# Patient Record
Sex: Male | Born: 1935 | Race: White | Hispanic: No | Marital: Married | Smoking: Never smoker
Health system: Southern US, Community
[De-identification: ages and names within clinical notes are randomized; demographics above are authoritative.]

## PROBLEM LIST (undated history)

## (undated) DIAGNOSIS — E039 Hypothyroidism, unspecified: Secondary | ICD-10-CM

## (undated) DIAGNOSIS — E079 Disorder of thyroid, unspecified: Secondary | ICD-10-CM

## (undated) DIAGNOSIS — I10 Essential (primary) hypertension: Secondary | ICD-10-CM

## (undated) DIAGNOSIS — E7800 Pure hypercholesterolemia, unspecified: Secondary | ICD-10-CM

## (undated) DIAGNOSIS — R7303 Prediabetes: Secondary | ICD-10-CM

## (undated) DIAGNOSIS — C61 Malignant neoplasm of prostate: Secondary | ICD-10-CM

## (undated) DIAGNOSIS — M8580 Other specified disorders of bone density and structure, unspecified site: Secondary | ICD-10-CM

## (undated) HISTORY — PX: TONSILLECTOMY: SUR1361

## (undated) HISTORY — DX: Prediabetes: R73.03

## (undated) HISTORY — DX: Malignant neoplasm of prostate: C61

## (undated) HISTORY — DX: Pure hypercholesterolemia, unspecified: E78.00

## (undated) HISTORY — DX: Disorder of thyroid, unspecified: E07.9

## (undated) HISTORY — PX: PROSTATE BIOPSY: SHX241

## (undated) HISTORY — DX: Other specified disorders of bone density and structure, unspecified site: M85.80

## (undated) HISTORY — DX: Essential (primary) hypertension: I10

## (undated) HISTORY — PX: BRAIN MENINGIOMA EXCISION: SHX576

---

## 2001-09-22 ENCOUNTER — Other Ambulatory Visit: Admission: RE | Admit: 2001-09-22 | Discharge: 2001-09-22 | Payer: Self-pay | Admitting: Dermatology

## 2002-10-23 ENCOUNTER — Ambulatory Visit (HOSPITAL_COMMUNITY): Admission: RE | Admit: 2002-10-23 | Discharge: 2002-10-23 | Payer: Self-pay | Admitting: General Surgery

## 2002-10-23 ENCOUNTER — Encounter: Payer: Self-pay | Admitting: General Surgery

## 2004-01-07 ENCOUNTER — Ambulatory Visit (HOSPITAL_COMMUNITY): Admission: RE | Admit: 2004-01-07 | Discharge: 2004-01-07 | Payer: Self-pay | Admitting: General Surgery

## 2004-01-07 ENCOUNTER — Inpatient Hospital Stay (HOSPITAL_COMMUNITY): Admission: AD | Admit: 2004-01-07 | Discharge: 2004-01-09 | Payer: Self-pay | Admitting: Neurological Surgery

## 2004-01-13 ENCOUNTER — Inpatient Hospital Stay (HOSPITAL_COMMUNITY): Admission: RE | Admit: 2004-01-13 | Discharge: 2004-01-16 | Payer: Self-pay | Admitting: Neurological Surgery

## 2004-01-14 ENCOUNTER — Encounter (INDEPENDENT_AMBULATORY_CARE_PROVIDER_SITE_OTHER): Payer: Self-pay | Admitting: *Deleted

## 2004-02-03 ENCOUNTER — Ambulatory Visit: Payer: Self-pay | Admitting: Internal Medicine

## 2004-02-03 ENCOUNTER — Encounter (INDEPENDENT_AMBULATORY_CARE_PROVIDER_SITE_OTHER): Payer: Self-pay | Admitting: *Deleted

## 2004-02-03 ENCOUNTER — Inpatient Hospital Stay (HOSPITAL_COMMUNITY): Admission: AD | Admit: 2004-02-03 | Discharge: 2004-02-13 | Payer: Self-pay | Admitting: Neurological Surgery

## 2004-03-09 ENCOUNTER — Ambulatory Visit: Payer: Self-pay | Admitting: Internal Medicine

## 2004-04-04 ENCOUNTER — Ambulatory Visit (HOSPITAL_COMMUNITY): Admission: RE | Admit: 2004-04-04 | Discharge: 2004-04-04 | Payer: Self-pay | Admitting: Neurological Surgery

## 2005-01-18 ENCOUNTER — Ambulatory Visit (HOSPITAL_COMMUNITY): Admission: RE | Admit: 2005-01-18 | Discharge: 2005-01-18 | Payer: Self-pay | Admitting: Neurological Surgery

## 2006-01-15 ENCOUNTER — Ambulatory Visit (HOSPITAL_COMMUNITY): Admission: RE | Admit: 2006-01-15 | Discharge: 2006-01-15 | Payer: Self-pay | Admitting: Neurological Surgery

## 2007-09-24 ENCOUNTER — Ambulatory Visit (HOSPITAL_COMMUNITY): Admission: RE | Admit: 2007-09-24 | Discharge: 2007-09-24 | Payer: Self-pay | Admitting: Pediatrics

## 2007-11-04 ENCOUNTER — Encounter (HOSPITAL_COMMUNITY): Admission: RE | Admit: 2007-11-04 | Discharge: 2007-12-04 | Payer: Self-pay | Admitting: Oncology

## 2007-11-04 ENCOUNTER — Ambulatory Visit (HOSPITAL_COMMUNITY): Payer: Self-pay | Admitting: Oncology

## 2007-11-11 ENCOUNTER — Ambulatory Visit: Admission: RE | Admit: 2007-11-11 | Discharge: 2007-11-26 | Payer: Self-pay | Admitting: Radiation Oncology

## 2007-12-05 ENCOUNTER — Encounter (HOSPITAL_COMMUNITY): Admission: RE | Admit: 2007-12-05 | Discharge: 2007-12-31 | Payer: Self-pay | Admitting: Oncology

## 2008-06-02 ENCOUNTER — Ambulatory Visit (HOSPITAL_COMMUNITY): Payer: Self-pay | Admitting: Oncology

## 2008-10-27 ENCOUNTER — Ambulatory Visit (HOSPITAL_COMMUNITY): Payer: Self-pay | Admitting: Oncology

## 2008-10-27 ENCOUNTER — Encounter (HOSPITAL_COMMUNITY): Admission: RE | Admit: 2008-10-27 | Discharge: 2008-11-26 | Payer: Self-pay | Admitting: Oncology

## 2009-01-04 ENCOUNTER — Ambulatory Visit (HOSPITAL_COMMUNITY): Admission: RE | Admit: 2009-01-04 | Discharge: 2009-01-04 | Payer: Self-pay | Admitting: Urology

## 2009-01-05 ENCOUNTER — Encounter (HOSPITAL_COMMUNITY): Admission: RE | Admit: 2009-01-05 | Discharge: 2009-02-04 | Payer: Self-pay | Admitting: Oncology

## 2009-01-05 ENCOUNTER — Ambulatory Visit (HOSPITAL_COMMUNITY): Payer: Self-pay | Admitting: Oncology

## 2009-03-30 ENCOUNTER — Ambulatory Visit (HOSPITAL_COMMUNITY): Payer: Self-pay | Admitting: Oncology

## 2009-03-30 ENCOUNTER — Encounter (HOSPITAL_COMMUNITY): Admission: RE | Admit: 2009-03-30 | Discharge: 2009-04-29 | Payer: Self-pay | Admitting: Oncology

## 2009-06-08 ENCOUNTER — Encounter (HOSPITAL_COMMUNITY): Admission: RE | Admit: 2009-06-08 | Discharge: 2009-07-08 | Payer: Self-pay | Admitting: Oncology

## 2009-06-20 ENCOUNTER — Ambulatory Visit (HOSPITAL_COMMUNITY): Payer: Self-pay | Admitting: Oncology

## 2009-09-06 ENCOUNTER — Ambulatory Visit (HOSPITAL_COMMUNITY): Payer: Self-pay | Admitting: Oncology

## 2009-09-06 ENCOUNTER — Encounter (HOSPITAL_COMMUNITY)
Admission: RE | Admit: 2009-09-06 | Discharge: 2009-09-30 | Payer: Self-pay | Source: Home / Self Care | Admitting: Oncology

## 2009-11-07 ENCOUNTER — Ambulatory Visit (HOSPITAL_COMMUNITY): Payer: Self-pay | Admitting: Oncology

## 2009-11-07 ENCOUNTER — Encounter (HOSPITAL_COMMUNITY)
Admission: RE | Admit: 2009-11-07 | Discharge: 2009-12-07 | Payer: Self-pay | Source: Home / Self Care | Admitting: Oncology

## 2010-01-03 ENCOUNTER — Encounter (HOSPITAL_COMMUNITY)
Admission: RE | Admit: 2010-01-03 | Discharge: 2010-01-31 | Payer: Self-pay | Source: Home / Self Care | Attending: Oncology | Admitting: Oncology

## 2010-01-03 ENCOUNTER — Ambulatory Visit (HOSPITAL_COMMUNITY)
Admission: RE | Admit: 2010-01-03 | Discharge: 2010-01-31 | Payer: Self-pay | Source: Home / Self Care | Attending: Oncology | Admitting: Oncology

## 2010-01-22 ENCOUNTER — Encounter: Payer: Self-pay | Admitting: Neurological Surgery

## 2010-01-25 ENCOUNTER — Ambulatory Visit (HOSPITAL_COMMUNITY)
Admission: RE | Admit: 2010-01-25 | Discharge: 2010-01-25 | Payer: Self-pay | Source: Home / Self Care | Attending: Urology | Admitting: Urology

## 2010-02-28 ENCOUNTER — Other Ambulatory Visit (HOSPITAL_COMMUNITY): Payer: Medicare Other

## 2010-02-28 ENCOUNTER — Ambulatory Visit (HOSPITAL_COMMUNITY): Payer: Medicare Other

## 2010-03-13 LAB — PSA: PSA: 0.52 ng/mL (ref <=4.00)

## 2010-03-14 LAB — PSA: PSA: 0.38 ng/mL (ref <=4.00)

## 2010-03-16 LAB — PSA: PSA: 0.29 ng/mL (ref 0.10–4.00)

## 2010-03-19 LAB — BASIC METABOLIC PANEL
BUN: 15 mg/dL (ref 6–23)
CO2: 32 mEq/L (ref 19–32)
Calcium: 9.6 mg/dL (ref 8.4–10.5)
Chloride: 100 mEq/L (ref 96–112)
Creatinine, Ser: 1.18 mg/dL (ref 0.4–1.5)
GFR calc Af Amer: >60 mL/min (ref >60)
GFR calc non Af Amer: >60 mL/min (ref >60)
Glucose, Bld: 100 mg/dL — ABNORMAL HIGH (ref 70–99)
Potassium: 2.8 mEq/L — ABNORMAL LOW (ref 3.5–5.1)
Sodium: 140 mEq/L (ref 135–145)

## 2010-03-19 LAB — CBC
HCT: 39.1 % (ref 39.0–52.0)
Hemoglobin: 13.6 g/dL (ref 13.0–17.0)
MCH: 33 pg (ref 26.0–34.0)
MCHC: 34.8 g/dL (ref 30.0–36.0)
MCV: 94.8 fL (ref 78.0–100.0)
Platelets: 193 10*3/uL (ref 150–400)
RBC: 4.12 MIL/uL — ABNORMAL LOW (ref 4.22–5.81)
RDW: 13.4 % (ref 11.5–15.5)
WBC: 8.4 10*3/uL (ref 4.0–10.5)

## 2010-03-19 LAB — PSA
PSA: 0.34 ng/mL (ref 0.10–4.00)
PSA: 0.34 ng/mL (ref 0.10–4.00)

## 2010-03-19 LAB — TESTOSTERONE
Testosterone: 33.56 ng/dL — ABNORMAL LOW (ref 350–890)
Testosterone: 19.67 ng/dL — ABNORMAL LOW (ref 350–890)

## 2010-03-19 LAB — TESTOSTERONE, % FREE: Testosterone-% Free: 2 % — ABNORMAL HIGH (ref 1.6–2.9)

## 2010-03-19 LAB — TESTOSTERONE, FREE: Testosterone, Free: 4 pg/mL — ABNORMAL LOW (ref 47.0–244.0)

## 2010-03-19 LAB — SEX HORMONE BINDING GLOBULIN: Sex Hormone Binding: 26 nmol/L (ref 13–71)

## 2010-03-20 LAB — PSA: PSA: 0.28 ng/mL (ref 0.10–4.00)

## 2010-04-25 ENCOUNTER — Encounter (HOSPITAL_COMMUNITY): Payer: Medicare Other | Attending: Oncology

## 2010-04-25 DIAGNOSIS — C61 Malignant neoplasm of prostate: Secondary | ICD-10-CM

## 2010-05-01 ENCOUNTER — Ambulatory Visit (HOSPITAL_COMMUNITY): Payer: Medicare Other | Admitting: Oncology

## 2010-05-03 ENCOUNTER — Encounter (HOSPITAL_COMMUNITY): Payer: Medicare Other | Attending: Oncology | Admitting: Oncology

## 2010-05-03 DIAGNOSIS — C7982 Secondary malignant neoplasm of genital organs: Secondary | ICD-10-CM

## 2010-05-03 DIAGNOSIS — C61 Malignant neoplasm of prostate: Secondary | ICD-10-CM | POA: Insufficient documentation

## 2010-05-09 ENCOUNTER — Other Ambulatory Visit (HOSPITAL_COMMUNITY): Payer: Self-pay | Admitting: Oncology

## 2010-05-09 DIAGNOSIS — C61 Malignant neoplasm of prostate: Secondary | ICD-10-CM
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
## 2010-05-19 NOTE — Discharge Summary (Signed)
NAME:  Shane [name], Shane [name] NO.:  [id number]   MEDICAL RECORD NO.:  [id number]          PATIENT TYPE:  INP   LOCATION:  3029                         FACILITY:  [facility]   PHYSICIAN:  Payton Doughty, M.D.      DATE OF BIRTH:  11-Aug-1934   DATE OF ADMISSION:  02/03/2004  DATE OF DISCHARGE:  02/13/2004                                 DISCHARGE SUMMARY   ADMISSION DIAGNOSIS:  Dilantin reaction.   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   DICTATING DOCTOR:  Dr. Channing Mutters __________ Neurosurgery.   ATTENDING:  Dr. Danielle Dess.   CONSULTS:  Obtained from the [facility] and from Infectious Diseases  as well as Ophthalmology.   This is a 76 year old gentleman who had had a meningioma resected about  three weeks prior to admission.  Three days before admission he began having  fevers, he had a rash on his neck and he had placed cortisone on it.  He was  switched to Phenobarbital.  Lesions began developing in his mouth a couple  of days before admission, and he was having some difficulty swallowing.  He  was admitted with a rash and visited by the medicine service.  It was felt  that he was having a severe Dilantin reaction.  He had hydration and was  placed on oral steroids.  He developed some opacities over his eyes, he was  visited by Dr. Dagoberto Ligas of Ophthalmology, as well as Dr. Orvan Falconer of  Infectious Disease.  Because of concerns about viral infestation, he was  placed on Valtrex and a host of eye medications by Dr. Dagoberto Ligas.  Over  the next few days his ophthalmologic problems began resolving, as did his  difficulties with increased liver enzymes, etcetera.  He has remained on  Decadron and over the 7-8 week course of these medications, he feels  markedly better.  He has a lot of oral sloughing, which is stable, he is  able to eat and void normally now.  He has been switched over to oral and  topical medications.  He feels well  enough to go home.  He is afebrile at  this time.  He is going home on the eye medications as recommended by Dr.  Dagoberto Ligas, as well on the oral Decadron.  The Valtrex has been stopped.  His followup will be with Dr. Danielle Dess via phone call tomorrow, and via visit  later in the week.      MWR/MEDQ  D:  02/13/2004  T:  02/13/2004  Job:  [id number]

## 2010-05-19 NOTE — Discharge Summary (Signed)
NAME:  MICHAELJAMES, MILNES NO.:  [id number]   MEDICAL RECORD NO.:  [id number]          PATIENT TYPE:  OIB   LOCATION:  3109                         FACILITY:  [facility]   PHYSICIAN:  Coletta Memos, M.D.     DATE OF BIRTH:  [birth year]   DATE OF ADMISSION:  01/13/2004  DATE OF DISCHARGE:  01/16/2004                                 DISCHARGE SUMMARY   ADMISSION DIAGNOSIS:  Right frontal meningioma.   DISCHARGE DIAGNOSIS:  Right frontal meningioma.   PROCEDURE:  Right frontal craniotomy with gross total resection of  meningioma.   COMPLICATIONS:  None.   CONDITION ON DISCHARGE:  Alive and well.   DISCHARGE MEDICATIONS:  1.  Decadron taper over 10 days 4 mg t.i.d. x3, 4 mg p.o. b.i.d. x3, 4 mg      daily x4.  2.  Dilantin 100 mg p.o. t.i.d. given 100 tablets.  3.  Tylenol for pain.   HISTORY OF PRESENT ILLNESS:  The patient is a gentleman who is seen and  evaluated by Stefani Dama, M.D. with regards to newly diagnosed brain  tumor.  It was felt in his best interest that it be removed.  Secondary to  that, he was admitted and underwent an uncomplicated craniotomy for removal  of the tumor.  Postoperatively, his course was complicated by what was  either epistaxis or possibly a rhinorrhea.  There was no actual evidence of  rhinorrhea, other than the fact that the sanguineous drainage was very thin.  It think it is much more likely that this was epistaxis and residual left  from his intubation.  CT was done postoperatively, but it did not show a  breach of the frontal sinuses, nor any direct communication between the  intracranial cavity and the sinuses.  There is no fluid in the sinuses  either.  The serosanguineous drainage resolved on its own and he has been  dry for the last 24 hours.  Given that and given his normal neurologic  examination, the patient will be discharged home.  His wound is clean, dry,  and without signs of infection.  He will have a return  appointment in  approximately 7 to 10 days for suture removal.  The patient knows he is not  to drive nor engage in any strenuous activity.  He may wash his hair, he is  simply not to scrub the incision.  He is tolerating a regular diet and  ambulating well.       KC/MEDQ  D:  01/16/2004  T:  01/16/2004  Job:  [id number]

## 2010-05-19 NOTE — Op Note (Signed)
NAME:  Shane [name], Shane [name] NO.:  [id number]   MEDICAL RECORD NO.:  [id number]          PATIENT TYPE:  OIB   LOCATION:  3109                         FACILITY:  [facility]   PHYSICIAN:  Stefani Dama, M.D.  DATE OF BIRTH:  [birth year]   DATE OF PROCEDURE:  01/13/2004  DATE OF DISCHARGE:                                 OPERATIVE REPORT   PREOPERATIVE DIAGNOSIS:  Right frontal brain tumor.   POSTOPERATIVE DIAGNOSIS:  Same, meningioma.   PROCEDURES:  Right frontal craniotomy, gross total resection of brain tumor,  meningioma.   SURGEON:  Stefani Dama, M.D.   ASSISTANT:  Cristi Loron, M.D.   ANESTHESIA:  General endotracheal.   INDICATIONS:  Mr. Bethea is a 76 year old individual who has had history  of a fall about a week ago with some unexplained loss of balance. CT scan of  the brain was performed demonstrating presence of a large right frontal  meningioma. The patient was taken to the operating room to undergo surgical  resection of this lesion at the current time.   PROCEDURE:  The patient was brought to the operating room and placed on  table in the supine position. After smooth induction of general endotracheal  anesthesia. He was placed in a three-point head rest after shaving the scalp  completely. His head was slightly turned to left to expose the right frontal  region. The scalp was then prepared by scrubbing it with Iodoform scrub and  shaving it closely and then DuraPrep prep was performed. The right frontal  region was marked for a standard right frontal craniotomy and the scalp was  marked. The skin was infiltrated with 0.05% Marcaine with 50/50 with  lidocaine and epinephrine. The scalp was then incised and the scalp flap was  raised frontally. Hemostasis from the galeal edges was obtained with the use  of Raney clips. The galea was elevated anteriorly. Temporalis fascia was  then separated and reflected anteriorly also. The underlying bone  was  dissected free and the dissection was then carried in the subperiosteal  plane down to the level of the brow. A bur hole was then placed just  anterior to the coronal suture about 3 cm lateral to it. From this then a  high-speed craniotome was used to elevate the bone flap anteriorly into the  frontal region low across the brow just above the frontal sinus and  laterally out towards the temporalis fossa. A second bur hole was then  placed over the keyhole site and then the craniotomy was completed by  dissecting from the keyhole site with the high-speed drill removing a flap  of bone that ultimately crossed at the meningeal artery which bled  profusely. Bone flap was elevated and controlling the middle meningeal  artery was obtained as quickly as possible. The bone was noted to be  infiltrated with tumor in the midportion and the tumor itself also bled  profusely. This was tamponaded with some pledgets of Gelfoam soaked in  thrombin and 1 x 1 cottonoid patties. Once the middle meningeal artery was  controlled, the dura was  then inspected and the posterior aspect of the dura  was incised and was reflected forward so as to allow for dissection of the  tumor which was densely adherent to the cortical surface of the brain. Then  by doing a piecemeal dissection of the dura and the brain tumor away from  the brain proper, the frontal lobe was secured and a sub-peel dissection was  performed here to minimize trauma to the brain itself. The tumor was  ultimately elevated on a large flap of dura which was clearly involved with  tumor. This tumor was elevated. Hemostasis was readily obtained from the  peel surfaces around the edges of the brain and the dural edges were  cauterized during this resection so that they were also hemostatic at this  point. The dural edges were identified completely around the resected brain  tumor and it was felt that a flap of bovine pericardium would fill this   area. A 6 x 8 cm piece of Duragard was used for this purpose. This was sewn  into the dural defect in a circumferential fashion using interrupted 4-0  Nurolon sutures. This was performed after adequate hemostasis of all peel  edges of the brain was assured and it was assured that no remnants of tumor  were left either attached to the brain or attached to the lateral aspects of  the dura. With this the dural closure was performed in such fashion so that  it was watertight. Then tack up sutures were placed around the perimetry of  the bone. The undersurface of the bone was shaved slightly where thickened  pericranium was noted and this was done so as to make sure that there was no  remnants of any brain tumor here. The bone flap was then inspected and the  gross tumor involvement was removed and the bone was shaved until all  remnants of any tumor that was in the undersurface of the bone was removed  also. The bone was noted to be significantly hyperostotic in reaction to the  tumor itself. The bone flap was then irrigated copiously with antibiotic  irrigating solution and then several popping tack-up holes were placed  through the center of the flap. Once the dura was secured, Osteomed bone  plates were used around the perimetry. A total of five Osteomed plates of  medium diameter were used to secure the bone flap. A bur hole cover was used  over the posterior bur hole. The keyhole could not be secured with any bur  hole flap. The popping suture was tied up in the center of the dural opening  and finally the galea was reflected. The temporalis fascia was closed with 2-  0 Vicryl in interrupted fashion. The galea was closed with 2-0 Vicryl in  inverted interrupted fashion and the scalp was closed with nylon. Blood loss  for the entire procedure was estimated at 800 cc. Frozen section diagnosis  was very typical-looking meningioma.      Henr  HJE/MEDQ  D:  01/13/2004  T:  01/14/2004  Job:   [id number]

## 2010-05-19 NOTE — Discharge Summary (Signed)
NAME:  Shane [name], SANZO NO.:  [id number]   MEDICAL RECORD NO.:  [id number]          PATIENT TYPE:  INP   LOCATION:  3036                         FACILITY:  [facility]   PHYSICIAN:  Stefani Dama, M.D.  DATE OF BIRTH:  March 17, 1934   DATE OF ADMISSION:  01/07/2004  DATE OF DISCHARGE:  01/09/2004                                 DISCHARGE SUMMARY   ADMITTING DIAGNOSES:  Right frontal meningioma.   DISCHARGE DIAGNOSES:  Right frontal meningioma.   OPERATION:  None.   HOSPITAL COURSE:  Mr. Shane [name] is a 76 year old individual who was  diagnosed with a right frontal meningioma after he sustained a routine fall.  CT scan to rule out any intracranial trauma demonstrates the presence of a  large right frontal lesion with significant vasogenic edema.  He is admitted  after being seen in the office because of the degree of subfalcine  herniation that was present.  It was felt that he would benefit from high  dose corticosteroid therapy and this was administered on the evening of  admission.  Following day the patient was somewhat giddy and very euphoric.  He was changed over to oral steroid medications.  He was also started on  Dilantin.  His Dilantin level at the time of discharge is 15.6 and his  condition has stabilized.  He was noted to also be extremely hypertensive at  the time of discharge with blood pressures of 208/104.  His blood pressures  have come down spontaneously to a level of 160/83.  The patient is on  Maxzide as a diuretic.  He received some potassium supplementation for a  potassium of 3.3.  The plan is to have the patient readmitted for surgical  extirpation of this lesion this coming Thursday, the 12th of January.      Henr   HJE/MEDQ  D:  01/09/2004  T:  01/09/2004  Job:  [id number]

## 2010-05-19 NOTE — Consult Note (Signed)
NAME:  Shane [name], Shane [name]               ACCOUNT NO.:  [id number]   MEDICAL RECORD NO.:  [id number]          PATIENT TYPE:  INP   LOCATION:  3029                         FACILITY:  [facility]   PHYSICIAN:  Melissa L. Ladona Ridgel, MD  DATE OF BIRTH:  03/13/1934   DATE OF CONSULTATION:  02/03/2004  DATE OF DISCHARGE:                                   CONSULTATION   CHIEF COMPLAINT:  Rash and mouth lesions.   REFERRING PHYSICIAN:  Consult was called by Stefani Dama, M.D.   HISTORY OF PRESENT ILLNESS:  The patient is a 76 year old white male who is  admitted today with worsening rash and ulcerations of his mouth and throat.  The patient was started three weeks ago on Dilantin surrounding craniotomy  for resection of a meningioma.  Approximately three days ago the patient  started to have fevers as high as 102.  His wife recalls that he developed a  slight rash on his neck for which they put cortisone over the weekend.  By  early week, the patient was seen by Dr.  Danielle Dess who also noted that he had a  light pale rash on his upper arms.  The Dilantin was discontinued and he was  started on phenobarbital.  The patient states that the phenobarbital caused  him to be excessively sedated and so therefore, he stopped the medication  himself and in following up with Dr. Danielle Dess today, was found to have  worsening rash on the back, upper body and the ulcerations in the mouth and  trouble swallowing.  The patient states that the oral lesions developed over  the last two days.  The only other exposure the patient had and this was  after the onset of the rash was that he was started on Cipro earlier in the  week.  When Dr. Danielle Dess saw him, he thought that the temperatures may be  related to a urinary tract infection and the Cipro has been discontinued.  Of note the Cipro was after the initial onset of the rash.  Compounding the  current situation is the patient requires high dose steroids status post   craniotomy.   REVIEW OF SYSTEMS:  Significant for high fevers, no chills, no nausea, no  vomiting, no diarrhea.  The patient does not complain of abdominal pain.  He  did have some gas earlier.  He states that the rash is minimally itchy and  he is definitely having trouble swallowing.  All other review of systems are  negative.   PAST MEDICAL HISTORY:  1.  The patient had a meningioma.  He is status post resection.  2.  Hypertension.  3.  Hypothyroidism.   PAST SURGICAL HISTORY:  1.  Tonsillectomy.  2.  Craniotomy.   SOCIAL HISTORY:  He has never smoked. He drinks occasionally.  He is  employed as a Environmental manager.   FAMILY HISTORY:  Mother is deceased in her older age secondary to pneumonia.  She has really no other medical problems.  Father is deceased with  hypertension, coronary artery disease.   ALLERGIES:  No known drug allergies although  he appears to have had a severe  reaction to DILANTIN.   MEDICATIONS AT HOME:  1.  Maxzide 375/25 daily.  2.  Lovoxyl 75 mcg.   LABORATORY DATA:  Sodium 130, potassium 3.7, chloride 96, CO2 24, BUN 17,  creatinine 1.4.  Glucose 108.  His LFTs are elevated at 78 for the AST, 62  for the ALT.  Looking back in his results, January 6, his LFTs are within  normal limits.  His white blood cell count is 4900, his hemoglobin  is 12,  hematocrit 34.3, and platelet count 192,000.   PHYSICAL EXAMINATION:  VITAL SIGNS:  T-max is 102.8, current temperature is  99.4.  Blood pressure is 135/70, last pulse was 122 but this was associated  with fever.  My calculation of his pulse rate during exam was 85.  Respiratory rate is 20.  Oxygen saturations are 92% on room air.  GENERAL:  This a well-developed, well-nourished white male in mild distress  secondary to trouble swallowing and pain in his mouth.  HEENT:  He is normocephalic with a well-healed craniotomy scar involving the  right frontoparietal area.  Pupils equal, round and reactive to light.   Extraocular movements are intact.  Mucous membrane are excessively moist  with obvious ulcerations on the palate and desquamating skin.  There is no  stridor noted.  He does have mild lip crusting.  NECK:  His neck is supple, but tender with nonmobile, mildly enlarged lymph  nodes.  He has no bruits.  He does have a macular papular pustular rash all  over his face and his conjunctivae are red and injected.  There is obvious  mucus on the lid margins and eye lashes.  CHEST:  Clear to auscultation with rhonchi, rales or wheezes.  He is  decreased at both bases.  CARDIOVASCULAR:  Regular rate and rhythm, positive S1 and S2.  No S3 or S4.  ABDOMEN:  Soft, nontender, nondistended with positive bowels.  He does have  a diffuse macular papular pustular rash all over this chest and abdomen.  EXTREMITIES:  Diffuse macular papular pustular rash on the upper extremities  and thighs down to the knees.  There does not appear to be lower extremity  or foot involvement.  There does not appear to be palmar involvement or sole  involvement.  NEUROLOGIC:  He is awake and alert, oriented times three.  Cranial nerves II-  XII are intact.  Power 5/5.   ASSESSMENT/PLAN:  The patient is a 76 year old white male status post  craniotomy early in January to resect a meningioma. The patient is admitted  today trouble swallowing secondary to ulcerations in his mouth and  desquamation of his mucous membranes.  He has an associated diffuse macular  papular rash on the trunk and extremities.  The reaction is likely secondary  to Dilantin, which was discontinued at the beginning of the week.  This  likely represents Stevens-Johnson syndrome; however, toxic epidermal  necrolysis cannot be ruled out.  But based on the current constellation of  symptoms rash, mucus cutaneous involvement and ocular involvement with spiking temperature, I would favor Stevens-Johnson.  However, the elevated  liver function tests move the  syndrome closer to a TEN presentation.  Hopefully, the patient will continued to improve since the offending agent  has been discontinued.  However, he may have complications related to using  Decadron for his postoperative state.  He will be predisposed to  superinfection in particular.  I agree with discontinuation  of offending  agent, namely Dilantin and the removal of Cipro which likely was not  involved since the rash was there prior to being started but certainly it  clouds our view.  His current risks are for aspiration, superinfection,  hypotension, extension and worsening of the rash.  We will proceed as  follows:  1.  We will request a dermatology consult in the morning to confirm the      diagnosis.  2.  Pulmonary:  We will provide the patient with a Yankauer suction device      and teaching to assist him in mobilizing his secretions.  We will take a      chest x-ray to rule out aspiration or obvious infiltrates which can      appear with toxic epidermal necrolysis.  Will follow his saturations      closely as decompensated respiratory status can occur in TEN.  3.  Ophthalmological:  The patient appears to have conjunctival involvement.      Will recommend artificial tears, frequent cleansing of the mucosa and      eyelid margins.  We also recommend an ophthalmology consult in the a.m.      in order to assure that he received appropriate ophthalmologic care as      there are serious sequelae to Stevens-Johnson involvement of the eye.  4.  Gastrointestinal:  I agree with Pepcid twice daily to protect his      esophagus and stomach.  The patient should have a nutrition consult and      would recommend changing his diet to liquids and or purees with      aspiration precautions.  He can continue the Magic Mouthwash for his      mouth. The elevation in his liver enzymes can be associated with toxic      epidermal necrolysis and I would therefore follow his a.m. labs      closely.   5.  GU:  Will check a urinalysis and culture and sensitivity.  6.  Endocrine:  Will monitor his blood sugars on the Decadron and keep his      fasting blood sugars less than 150 to promote healing and decrease the      chances of a superinfection.  7.  Dermatology:  As stated I would request a consult in the morning.  The      primary service should obtain the consults.  The rule out needs to be      between Stevens-Johnson syndrome versus toxic epidermal necrolysis.  I      doubt that this represents a staphylococcal skin syndrome based on the      context and progression of the symptomatology; however, the patient is      immunocompromised.  It is therefore important to separate out the true      diagnosis.  I also will be sending herpes antibodies as in his      deconditioned state and immunological compromise state herpes simplex      can cause a similar reaction. 8.  Cardiovascular:  The patient was earlier quite tachycardiac.  This was      related to fever.  At this time this appears to have resolved.  I will      recommend frequent checks of his vital signs and we will gently      rehydrate him as already ordered by the primary service.   Thank you for allowing [place] to see your patient.  We  continue to follow with  you and assist you in his care.      MLT/MEDQ  D:  02/03/2004  T:  02/04/2004  Job:  [id number]   cc:   Stefani Dama, M.D.  [address]  [place]  [place] [address]  Fax: [phone]

## 2010-05-19 NOTE — H&P (Signed)
NAME:  Shane, [name] NO.:  [id number]   MEDICAL RECORD NO.:  [id number]          PATIENT TYPE:  INP   LOCATION:  3029                         FACILITY:  [facility]   PHYSICIAN:  Stefani Dama, M.D.  DATE OF BIRTH:  Jun 20, 1934   DATE OF ADMISSION:  02/03/2004  DATE OF DISCHARGE:                                HISTORY & PHYSICAL   ADMISSION DIAGNOSES:  1.  Dilantin reaction.  2.  Fever.  3.  Pharyngitis.   HISTORY OF PRESENT ILLNESS:  Mr. Shane [name] is a 76 year old individual who  had a craniotomy for resection of a large right frontal meningioma  approximately two and a half weeks ago. He was discontinued home without any  difficulties and did well until the beginning of this week when he noticed  that he was feeling poorly and he had a fever to 101. On evaluation in the  office a few days ago it was felt that he had a Dilantin reaction. He was  advised to stop the Dilantin, he was given some phenobarbital for a few days  and also started on some Cipro because of the concern that he might have  urinary tract infection. Despite this his fevers seem to get worse. The  patient's rash was decidedly worse in the last two days and yesterday he  developed significant symptoms of a sore throat, swelling of his tongue and  his oral mucosa, and difficulty with swallowing. All symptoms and signs  consistent with a pharyngitis. Because he was feeling so poorly and had a  fever to 104 last night, I advised to come to the office. On evaluation in  the office I note that he has white, spotted, and slick areas on his tongue.  He has oral lesions consistent with oral thrush. The patient also has a  maculopapular rash that is very prominent about his scalp, his trunk, and  all his extremities, despite the fact that he has not had Dilantin now for  three days. At this point he is to be admitted to the hospital to undergo IV  hydration, IV Decadron, some treatment for the oral thrush  with a Magic  Mouthwash solution, and further observation. Hospitalist consult will be  obtained.   PHYSICAL EXAMINATION:  At this time, the rash as noted. His blood pressure  is 122/72, heart rate 88 and regular. He has no orthostatic signs. His lungs  are clear to auscultation. His heart has a regular rate and rhythm.  The  abdomen is soft, bowel sounds positive. No masses are palpable. Extremities  reveal no clubbing, cyanosis, or edema.   IMPRESSION:  1.  Fever secondary to Dilantin reaction.  2.  Oral pharyngitis secondary to Dilantin reaction.   PLAN:  IV hydration and Decadron.      HJE/MEDQ  D:  02/03/2004  T:  02/03/2004  Job:  [id number]

## 2010-05-23 ENCOUNTER — Other Ambulatory Visit (HOSPITAL_COMMUNITY): Payer: Medicare Other

## 2010-05-31 ENCOUNTER — Encounter (HOSPITAL_COMMUNITY): Payer: Medicare Other

## 2010-05-31 ENCOUNTER — Other Ambulatory Visit (HOSPITAL_COMMUNITY): Payer: Self-pay | Admitting: Oncology

## 2010-05-31 DIAGNOSIS — C61 Malignant neoplasm of prostate: Secondary | ICD-10-CM

## 2010-06-27 ENCOUNTER — Encounter (HOSPITAL_COMMUNITY): Payer: Medicare Other | Attending: Oncology

## 2010-06-27 ENCOUNTER — Other Ambulatory Visit (HOSPITAL_COMMUNITY): Payer: Self-pay | Admitting: Oncology

## 2010-06-27 DIAGNOSIS — C61 Malignant neoplasm of prostate: Secondary | ICD-10-CM | POA: Insufficient documentation

## 2010-07-25 ENCOUNTER — Encounter (HOSPITAL_COMMUNITY): Payer: Medicare Other | Attending: Oncology

## 2010-07-25 DIAGNOSIS — C61 Malignant neoplasm of prostate: Secondary | ICD-10-CM | POA: Insufficient documentation

## 2010-07-25 NOTE — Progress Notes (Signed)
Labs drawn today for psa 

## 2010-07-26 ENCOUNTER — Telehealth (HOSPITAL_COMMUNITY): Payer: Self-pay | Admitting: *Deleted

## 2010-07-26 ENCOUNTER — Telehealth (HOSPITAL_COMMUNITY): Payer: Self-pay

## 2010-07-26 NOTE — Telephone Encounter (Signed)
Message copied by Dennie Maizes on Wed Jul 26, 2010 11:07 AM ------      Message from: Mariel Sleet, ERIC S      Created: Wed Jul 26, 2010 10:38 AM       Lower-call him!

## 2010-07-26 NOTE — Telephone Encounter (Signed)
Results of PSA given to patient

## 2010-07-26 NOTE — Telephone Encounter (Signed)
Message left for patient to call clinic.

## 2010-08-22 ENCOUNTER — Encounter (HOSPITAL_COMMUNITY): Payer: Medicare Other | Attending: Oncology

## 2010-08-22 DIAGNOSIS — C61 Malignant neoplasm of prostate: Secondary | ICD-10-CM | POA: Insufficient documentation

## 2010-08-22 LAB — COMPREHENSIVE METABOLIC PANEL
ALT: 16 U/L (ref 0–53)
CO2: 35 mEq/L — ABNORMAL HIGH (ref 19–32)
Calcium: 10.4 mg/dL (ref 8.4–10.5)
Creatinine, Ser: 1.5 mg/dL — ABNORMAL HIGH (ref 0.50–1.35)
GFR calc Af Amer: 55 mL/min — ABNORMAL LOW (ref >60)
GFR calc non Af Amer: 46 mL/min — ABNORMAL LOW (ref >60)
Glucose, Bld: 115 mg/dL — ABNORMAL HIGH (ref 70–99)

## 2010-08-22 LAB — DIFFERENTIAL
Basophils Absolute: 0.1 10*3/uL (ref 0.0–0.1)
Basophils Relative: 1 % (ref 0–1)
Eosinophils Relative: 3 % (ref 0–5)
Lymphocytes Relative: 29 % (ref 12–46)
Neutro Abs: 5.2 10*3/uL (ref 1.7–7.7)

## 2010-08-22 LAB — CBC
Hemoglobin: 13.3 g/dL (ref 13.0–17.0)
MCH: 31.9 pg (ref 26.0–34.0)
MCV: 95.4 fL (ref 78.0–100.0)
RBC: 4.17 MIL/uL — ABNORMAL LOW (ref 4.22–5.81)

## 2010-08-22 LAB — PSA: PSA: 0.59 ng/mL (ref <=4.00)

## 2010-08-22 NOTE — Progress Notes (Signed)
Labs drawn today for cbc/diff,cmp,psa 

## 2010-08-23 ENCOUNTER — Encounter (HOSPITAL_COMMUNITY): Payer: Self-pay | Admitting: Oncology

## 2010-08-23 ENCOUNTER — Encounter (HOSPITAL_BASED_OUTPATIENT_CLINIC_OR_DEPARTMENT_OTHER): Payer: Medicare Other | Admitting: Oncology

## 2010-08-23 VITALS — BP 168/78 | HR 55 | Temp 98.1°F | Ht 69.0 in | Wt 205.6 lb

## 2010-08-23 DIAGNOSIS — C61 Malignant neoplasm of prostate: Secondary | ICD-10-CM

## 2010-08-23 MED ORDER — NON FORMULARY: 22.5000 mg | Status: DC

## 2010-08-23 NOTE — Progress Notes (Signed)
CC:   Maryln Gottron, M.D. Astrid Divine, MD  DIAGNOSES: 1. Metastatic prostate cancer to periaortic lymph node biopsy proven     left side with biopsy 09/02/2007.  He has been on Trelstar 22.5 mg     every 6 months by injection at Dr. Scharlene Gloss office and most recent     was on 07/28/2010.  He had initial nice reduction in his PSA from     12.34-0.09.  It then rose to 1.3 and we added Casodex 50 mg once a     day in June and it has dropped to 0.59. 2. Dilantin induced Stevens-Johnson syndrome. 3. Benign prostatic hypertrophy. 4. Hypothyroidism. 5. Hypertension no longer lisinopril, but his blood pressure today is     184/72 in the right arm sitting position and he may need to have     reinstitution of that, but he will take his blood pressure when he     gets home and then check his blood pressure twice day and call Dr.     Stephania Fragmin if need be. 6. Osteopenia with good AP spine bone density but slight weakness in     the femur.  Will repeat another one in January 2013.  He is on     vitamin D 1000 units a day and calcium 1200 mg a day which we will     reduce to 600 mg a day.  Kaiyden is here with his wife and Efraim does not admit to a lot of issues. He is of course impotent because of the therapy.  He has good appetite, it is excellent.  He is not as active as he was 2 years ago.  He is working out at [employer] two times a week, not three times a week.  He has a little bit more lethargy he states.  His vitals show his weight to still be more than adequate, in fact higher than it should at 205 pounds.  It is up from 202 pounds in May.  His blood pressure when the nurse took it was 168/78.  When I took it, it was 184/72.  He is in no acute distress.  His labs show his PSA yesterday was 0.59 down from 1.3 in May when we started the Casodex, actually in mid June when we got the result.  He looks good, feels good, otherwise does not complain of any issues. He had a lot of  questions, however, about calcium and prostate cancer, vitamin D and prostate cancer, oregano and prostate cancer, and we went over some of these issues that he brought up with he and his wife.  So we spent 30 minutes together basically on counseling and coordination of care.  We will take over his Trelstar injections every 6 months. Will continue with Casodex with continued monthly PSAs.  Will do a bone density in January.  I had to talk to the pharmacy people about getting Trelstar on the formulary since it is not at this time, so that is the only thing I could not order.  We will see him back in 4 months, sooner if need be.    ______________________________ Ladona Horns. Mariel Sleet, MD ESN/MEDQ  D:  08/23/2010  T:  08/23/2010  Job:  [id number]

## 2010-08-23 NOTE — Patient Instructions (Signed)
[facility]  Discharge Instructions  RECOMMENDATIONS MADE BY THE CONSULTANT AND ANY TEST RESULTS WILL BE SENT TO YOUR REFERRING DOCTOR.    MEDICATIONS PRESCRIBED: Decrease Calcium to 600mg  daily.  INSTRUCTIONS GIVEN AND DISCUSSED: You need to check your blood pressure for the next couple of days. If it remains elevated you need to contact your medical doctor. You need to exercise more.   SPECIAL INSTRUCTIONS/FOLLOW-UP: Return to see [place] in December 2012. PSA levels monthly. We will reschedule Bone Density test to January 2013.    I acknowledge that I have been informed and understand all the instructions given to me and received a copy. I do not have any more questions at this time, but understand that I may call the Specialty Clinic at [facility] at [phone] during business hours should I have any further questions or need assistance in obtaining follow-up care.    __________________________________________  _____________  __________ Signature of Patient or Authorized Representative            Date                   Time    __________________________________________ Nurse's Signature

## 2010-08-23 NOTE — Progress Notes (Signed)
This office note has been dictated.

## 2010-08-23 NOTE — Progress Notes (Signed)
Addended by: Glenford Peers S on: 08/23/2010 12:13 PM   Modules accepted: Orders

## 2010-09-22 ENCOUNTER — Encounter (HOSPITAL_COMMUNITY): Payer: Medicare Other | Attending: Oncology

## 2010-09-22 DIAGNOSIS — C61 Malignant neoplasm of prostate: Secondary | ICD-10-CM | POA: Insufficient documentation

## 2010-09-22 NOTE — Progress Notes (Signed)
Labs draw today for psa

## 2010-09-25 ENCOUNTER — Telehealth (HOSPITAL_COMMUNITY): Payer: Self-pay | Admitting: *Deleted

## 2010-09-25 NOTE — Telephone Encounter (Signed)
Message copied by Dennie Maizes on Mon Sep 25, 2010 12:13 PM ------      Message from: Mariel Sleet, ERIC S      Created: Mon Sep 25, 2010 11:30 AM       Very stable-call him/

## 2010-09-25 NOTE — Telephone Encounter (Signed)
Spoke with pt, gave PSA results 0.64. Pt understands.

## 2010-10-03 LAB — CBC
MCHC: 34
Platelets: 234
RBC: 4.55
RDW: 13.1

## 2010-10-03 LAB — COMPREHENSIVE METABOLIC PANEL
ALT: 19
AST: 25
Albumin: 4
CO2: 32
Calcium: 10
GFR calc Af Amer: >60
Sodium: 138
Total Protein: 7.5

## 2010-10-03 LAB — FREE PSA: PSA, Free Pct: 24 (ref >25)

## 2010-10-06 LAB — COMPREHENSIVE METABOLIC PANEL
ALT: 20 U/L (ref 0–53)
AST: 23 U/L (ref 0–37)
Calcium: 9.6 mg/dL (ref 8.4–10.5)
Creatinine, Ser: 1.11 mg/dL (ref 0.4–1.5)
GFR calc Af Amer: >60 mL/min (ref >60)
Sodium: 140 mEq/L (ref 135–145)
Total Protein: 6.6 g/dL (ref 6.0–8.3)

## 2010-10-06 LAB — CBC
MCHC: 35.3 g/dL (ref 30.0–36.0)
Platelets: 224 10*3/uL (ref 150–400)
RDW: 13.3 % (ref 11.5–15.5)

## 2010-10-06 LAB — FREE PSA: PSA, Free: 0.2 ng/mL

## 2010-10-23 ENCOUNTER — Encounter (HOSPITAL_COMMUNITY): Payer: Medicare Other | Attending: Oncology

## 2010-10-23 DIAGNOSIS — C61 Malignant neoplasm of prostate: Secondary | ICD-10-CM

## 2010-10-24 LAB — PSA: PSA: 0.8 ng/mL (ref <=4.00)

## 2010-11-27 ENCOUNTER — Encounter (HOSPITAL_BASED_OUTPATIENT_CLINIC_OR_DEPARTMENT_OTHER): Payer: Medicare Other

## 2010-11-27 ENCOUNTER — Encounter (HOSPITAL_COMMUNITY): Payer: Medicare Other | Attending: Oncology

## 2010-11-27 DIAGNOSIS — C61 Malignant neoplasm of prostate: Secondary | ICD-10-CM

## 2010-11-27 DIAGNOSIS — Z23 Encounter for immunization: Secondary | ICD-10-CM | POA: Insufficient documentation

## 2010-11-27 LAB — PSA: PSA: 1.02 ng/mL (ref <=4.00)

## 2010-11-27 MED ORDER — INFLUENZA VIRUS VACC SPLIT PF IM SUSP
0.5000 mL | INTRAMUSCULAR | Status: AC
  Administered 2010-11-27: 0.5 mL via INTRAMUSCULAR

## 2010-11-27 MED ORDER — INFLUENZA VIRUS VACC SPLIT PF IM SUSP
INTRAMUSCULAR | Status: AC
  Filled 2010-11-27: qty 0.5

## 2010-11-27 NOTE — Progress Notes (Signed)
Labs drawn today for psa 

## 2010-11-27 NOTE — Progress Notes (Signed)
Tolerated flu vaccine well 

## 2010-12-13 ENCOUNTER — Encounter (HOSPITAL_COMMUNITY): Payer: Medicare Other | Attending: Oncology

## 2010-12-13 DIAGNOSIS — C61 Malignant neoplasm of prostate: Secondary | ICD-10-CM | POA: Insufficient documentation

## 2010-12-13 NOTE — Progress Notes (Signed)
Labs drawn today for psa 

## 2010-12-15 ENCOUNTER — Encounter (HOSPITAL_BASED_OUTPATIENT_CLINIC_OR_DEPARTMENT_OTHER): Payer: Medicare Other | Admitting: Oncology

## 2010-12-15 VITALS — BP 139/76 | HR 48 | Temp 98.0°F | Wt 205.0 lb

## 2010-12-15 DIAGNOSIS — C61 Malignant neoplasm of prostate: Secondary | ICD-10-CM

## 2010-12-15 DIAGNOSIS — C50919 Malignant neoplasm of unspecified site of unspecified female breast: Secondary | ICD-10-CM

## 2010-12-15 NOTE — Progress Notes (Signed)
CC:   Lesly Dukes, M.D. Shelby Dubin, MD  DIAGNOSES: 1. Metastatic prostate cancer to a periaortic lymph node, biopsy     proven on the left on 09/02/2007 at [facility],     and he has been taking Trelstar in the past, now on depo Lupron.     We also added Casodex in June, and his PSA went down initially but     is now starting to rise slowly. 2. Prostate cancer diagnosed July 2009 with 03 of 12 biopsies showing     benign glandular tissue, but at the time of possible resection he     had a poorly differentiated adenocarcinoma with a Gleason score of     really only 6-7, and went on to have only palliative procedures     because of the positive metastatic disease. 3. Dilantin-induced Stevens-Johnson syndrome years ago, in 2006. 4. Hemangioma resection in 2006 by Dr. Barnett Abu. 5. Benign prostatic hypertrophy in the past. 6. Hypothyroidism. 7. Hypertension. 8. Osteopenia, on therapy, and his bone density is stable but due     again in January. Camara feels fine, looks fine, still working part time every day.  He is accompanied by his wife today.  He, of course, is concerned about the fact that his PSA which I got down to 0.59 in August is now 1.09.  So in less than 6 months it has risen but not doubled.  His other blood work in August was fine.  We are going to repeat it in January, along with scans of the bone and CT chest, abdomen pelvis to restage him.  His physical examination today and review of systems are really quite unremarkable.  He is not having new pains, different pains; he has a good appetite.  Weight is stable at 205 pounds, 5 feet 9 inches tall, BSA is 2.13 m sq, BMI of 30.  Blood pressure 139/76 left arm sitting position.  Pulse right around 50 to 52 and regular.  Respirations 18 to 20 and unlabored.  He is afebrile.  He does not have lymphadenopathy. He has no gynecomastia that is distinctly obvious.  His lungs are clear. His  skin shows numerous benign seborrheic keratoses.  I think he also has eczema.  He also has extremely dry skin, but there is no adenopathy and no hepatosplenomegaly.  Bowel sounds are normal.  Heart shows a regular rhythm and rate, right around 52.  He has no S3 gallop or murmur.  He has no peripheral edema.  We are going to re-evaluate him, stage him, and see him back in January. I think the next move if his PSA and disease is progressing, is to try Taxotere-based therapy.  I do not want to do that to him unless he absolutely has symptoms or significant progression in other terms.  We want to go slow with him, treat him gently, try to keep his quality of life is good as it can be, and we will see him in January.  He understands this, as does his wife, and they are in agreement with the plan. We may be ab;e to try Abiraterone instead of chemo.  ______________________________ Ladona Horns. Mariel Sleet, MD ESN/MEDQ  D:  12/15/2010  T:  12/15/2010  Job:  [id number]

## 2010-12-15 NOTE — Patient Instructions (Signed)
Shane Hall  [id number] 04-06-1934  [facility]  Discharge Instructions  RECOMMENDATIONS MADE BY THE CONSULTANT AND ANY TEST RESULTS WILL BE SENT TO YOUR REFERRING DOCTOR.   EXAM FINDINGS BY MD TODAY AND SIGNS AND SYMPTOMS TO REPORT TO CLINIC OR PRIMARY MD: Will do some scans to see where you are.  Don't want to do anything unless we have to.  MEDICATIONS PRESCRIBED: none   INSTRUCTIONS GIVEN AND DISCUSSED: Other :  Report any bone pain, shortness of breath, etc.  SPECIAL INSTRUCTIONS/FOLLOW-UP: Return to Clinic: After Scans completed.   I acknowledge that I have been informed and understand all the instructions given to me and received a copy. I do not have any more questions at this time, but understand that I may call the Specialty Clinic at [facility] at [phone] during business hours should I have any further questions or need assistance in obtaining follow-up care.    __________________________________________  _____________  __________ Signature of Patient or Authorized Representative            Date                   Time    __________________________________________ Nurse's Signature

## 2010-12-15 NOTE — Progress Notes (Signed)
This office note has been dictated.

## 2011-01-15 ENCOUNTER — Encounter (HOSPITAL_COMMUNITY): Payer: Self-pay

## 2011-01-15 ENCOUNTER — Encounter (HOSPITAL_COMMUNITY): Payer: Medicare Other | Attending: Oncology

## 2011-01-15 DIAGNOSIS — C61 Malignant neoplasm of prostate: Secondary | ICD-10-CM | POA: Insufficient documentation

## 2011-01-15 DIAGNOSIS — C801 Malignant (primary) neoplasm, unspecified: Secondary | ICD-10-CM | POA: Insufficient documentation

## 2011-01-15 LAB — CBC
HCT: 41.7 % (ref 39.0–52.0)
Hemoglobin: 14.2 g/dL (ref 13.0–17.0)
MCH: 32.1 pg (ref 26.0–34.0)
MCHC: 34.1 g/dL (ref 30.0–36.0)
MCV: 94.3 fL (ref 78.0–100.0)
Platelets: 202 K/uL (ref 150–400)
RBC: 4.42 MIL/uL (ref 4.22–5.81)
RDW: 13.1 % (ref 11.5–15.5)
WBC: 9.3 K/uL (ref 4.0–10.5)

## 2011-01-15 LAB — COMPREHENSIVE METABOLIC PANEL
ALT: 15 U/L (ref 0–53)
AST: 22 U/L (ref 0–37)
Albumin: 4.1 g/dL (ref 3.5–5.2)
CO2: 32 mEq/L (ref 19–32)
Calcium: 10.2 mg/dL (ref 8.4–10.5)
Chloride: 100 mEq/L (ref 96–112)
GFR calc non Af Amer: 63 mL/min — ABNORMAL LOW (ref >90)
Sodium: 143 mEq/L (ref 135–145)

## 2011-01-15 NOTE — Progress Notes (Signed)
Labs drawn today for cbc,cmp,psa 

## 2011-01-22 ENCOUNTER — Encounter (HOSPITAL_COMMUNITY): Payer: Self-pay

## 2011-01-22 ENCOUNTER — Ambulatory Visit (HOSPITAL_COMMUNITY)
Admission: RE | Admit: 2011-01-22 | Discharge: 2011-01-22 | Disposition: A | Discharge status: Home / Self Care | Payer: Medicare Other | Source: Ambulatory Visit | Attending: Oncology | Admitting: Oncology

## 2011-01-22 ENCOUNTER — Encounter (HOSPITAL_COMMUNITY)
Admission: RE | Admit: 2011-01-22 | Discharge: 2011-01-22 | Disposition: A | Discharge status: Home / Self Care | Payer: Medicare Other | Source: Ambulatory Visit | Attending: Oncology | Admitting: Oncology

## 2011-01-22 DIAGNOSIS — C61 Malignant neoplasm of prostate: Secondary | ICD-10-CM

## 2011-01-22 DIAGNOSIS — E119 Type 2 diabetes mellitus without complications: Secondary | ICD-10-CM | POA: Insufficient documentation

## 2011-01-22 DIAGNOSIS — R599 Enlarged lymph nodes, unspecified: Secondary | ICD-10-CM | POA: Insufficient documentation

## 2011-01-22 DIAGNOSIS — I10 Essential (primary) hypertension: Secondary | ICD-10-CM | POA: Insufficient documentation

## 2011-01-22 DIAGNOSIS — R937 Abnormal findings on diagnostic imaging of other parts of musculoskeletal system: Secondary | ICD-10-CM | POA: Insufficient documentation

## 2011-01-22 MED ORDER — TECHNETIUM TC 99M MEDRONATE IV KIT
25.0000 | PACK | Freq: Once | INTRAVENOUS | Status: AC | PRN
  Administered 2011-01-22: 25 via INTRAVENOUS

## 2011-01-22 MED ORDER — IOHEXOL 300 MG/ML  SOLN
100.0000 mL | Freq: Once | INTRAMUSCULAR | Status: AC | PRN
  Administered 2011-01-22: 100 mL via INTRAVENOUS

## 2011-01-23 ENCOUNTER — Other Ambulatory Visit (HOSPITAL_COMMUNITY): Payer: Medicare Other

## 2011-01-23 ENCOUNTER — Other Ambulatory Visit (HOSPITAL_COMMUNITY): Payer: Self-pay | Admitting: Oncology

## 2011-01-23 ENCOUNTER — Encounter (HOSPITAL_BASED_OUTPATIENT_CLINIC_OR_DEPARTMENT_OTHER): Payer: Medicare Other

## 2011-01-23 VITALS — BP 157/80 | HR 50 | Temp 98.0°F

## 2011-01-23 DIAGNOSIS — C61 Malignant neoplasm of prostate: Secondary | ICD-10-CM

## 2011-01-23 DIAGNOSIS — C50919 Malignant neoplasm of unspecified site of unspecified female breast: Secondary | ICD-10-CM

## 2011-01-23 MED ORDER — SODIUM CHLORIDE 0.9 % IJ SOLN
10.0000 mL | INTRAMUSCULAR | Status: DC | PRN
  Filled 2011-01-23: qty 10

## 2011-01-23 MED ORDER — HEPARIN SOD (PORK) LOCK FLUSH 100 UNIT/ML IV SOLN
INTRAVENOUS | Status: AC
  Filled 2011-01-23: qty 5

## 2011-01-23 MED ORDER — HEPARIN SOD (PORK) LOCK FLUSH 100 UNIT/ML IV SOLN
500.0000 [IU] | Freq: Once | INTRAVENOUS | Status: DC
  Filled 2011-01-23: qty 5

## 2011-01-23 MED ORDER — LEUPROLIDE ACETATE (3 MONTH) 22.5 MG IM KIT
22.5000 mg | PACK | Freq: Once | INTRAMUSCULAR | Status: AC
  Administered 2011-01-23: 22.5 mg via INTRAMUSCULAR
  Filled 2011-01-23: qty 22.5

## 2011-01-23 MED ORDER — LEUPROLIDE ACETATE (3 MONTH) 22.5 MG IM KIT
22.5000 mg | PACK | Freq: Once | INTRAMUSCULAR | Status: DC
  Filled 2011-01-23: qty 22.5

## 2011-01-23 MED ORDER — SODIUM CHLORIDE 0.9 % IJ SOLN
INTRAMUSCULAR | Status: AC
  Filled 2011-01-23: qty 10

## 2011-01-23 NOTE — Progress Notes (Signed)
Shane Hall presents today for injection per MD orders. LupronDepot 22.5 mg administered IM in left Gluteal. Administration without incident. Patient tolerated well.

## 2011-01-24 ENCOUNTER — Encounter (HOSPITAL_BASED_OUTPATIENT_CLINIC_OR_DEPARTMENT_OTHER): Payer: Medicare Other | Admitting: Oncology

## 2011-01-24 ENCOUNTER — Encounter (HOSPITAL_COMMUNITY): Payer: Self-pay | Admitting: Oncology

## 2011-01-24 VITALS — BP 118/78 | HR 48 | Temp 98.0°F | Wt 203.6 lb

## 2011-01-24 DIAGNOSIS — C61 Malignant neoplasm of prostate: Secondary | ICD-10-CM

## 2011-01-24 DIAGNOSIS — M899 Disorder of bone, unspecified: Secondary | ICD-10-CM

## 2011-01-24 NOTE — Progress Notes (Signed)
CC:   Astrid Divine, MD Maryln Gottron, M.D.  DIAGNOSES: 1. 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  He has been taking Trelstar in the past, but is     now on Depo Lupron and we are going to try to get the 6 month     injections for him, but we had to give him just the 3 month     injection yesterday.  We also added Casodex in June because of a     rising PSA which dropped it down initially, but has started rising     once again slowly but surely. 2. Prostate cancer diagnosed July 2009 with 3 of 12 biopsies showing     benign glandular tissue, but he had a Gleason score of 6-7 for a     poorly differentiated adenocarcinoma.  Because of the biopsy, went     on have palliative procedures. 3. Dilantin induced Stevens-Johnson syndrome years ago in 2006. 4. Hemangioma resection 2006 by Dr. Barnett Abu. 5. BPH in the past. 6. Hypothyroidism. 7. Hypertension. 8. Osteopenia on therapy and his bone density is stable.  He had a     bone density in January 2012 which was stable at that time     according to the report. Juno is here today with his wife to go over the results of his bone scan and CT scans which I used for staging once again since his PSA had risen to 1.9 in December.  It was 0.64 in September and  had been fairly stable for a couple of months while on the Casodex, but as I mentioned rose to that level.  Interestingly his bone scan shows no evidence for metastatic disease, and a CT scan was initially read as showing lymphadenopathy in the pelvis consistent with metastatic disease even though, the lymph nodes were basically only 6-9 mm in size.  When I talked to Dr. Amil Amen in radiology, he was able to get the films from [facility] from 2009 where he had a CT scan at that time and indeed these lymph nodes throughout the abdomen and pelvis are all very  stable without any increase in size.  Therefore I think what we need to do with Jackob is just continue him on the same regimen since it is probably holding some things just in check for the most part, but with this mild biochemical relapse probably not controlling all of these prostate cancer cells, but I think it is worth just continuing.  His functional status is still excellent.  He has got a performance status of 0, and I think as long as he feels good and we are not dealing with overtly metastatic disease that is changing, we ought to leave him on the same therapy.  So what he, I and his wife have decided is just to re-evaluate him with bone scans and CT scans once his PSA gets to 4 whenever that is in the future or if there is a more rapid rise in his PSA that may dictate things to be looked at again as well. So I will see him in March.  We are going to go to every other month PSAs.  We are going to go to every 3 month Depo Lupron unless we can get the 6 month shot here.  He will continue  the Casodex.  I will see him back as scheduled, but he is a very delighted with this plan.    ______________________________ Ladona Horns. Mariel Sleet, MD ESN/MEDQ  D:  01/24/2011  T:  01/24/2011  Job:  [id number]

## 2011-01-24 NOTE — Progress Notes (Signed)
This office note has been dictated.

## 2011-01-24 NOTE — Patient Instructions (Signed)
[facility]  Discharge Instructions  RECOMMENDATIONS MADE BY THE CONSULTANT AND ANY TEST RESULTS WILL BE SENT TO YOUR REFERRING DOCTOR.   EXAM FINDINGS BY MD TODAY AND SIGNS AND SYMPTOMS TO REPORT TO CLINIC OR PRIMARY MD: The bone scans are normal. The CT scan is unchanged.  You received the 3 month shot of depo-lupron yesterday. So you will need this again in April. We will see if we can get the 6 month shot. INSTRUCTIONS GIVEN AND DISCUSSED: Labs every other month--due in March  SPECIAL INSTRUCTIONS/FOLLOW-UP: Return to Clinic to be seen in March after labs.   I acknowledge that I have been informed and understand all the instructions given to me and received a copy. I do not have any more questions at this time, but understand that I may call the Specialty Clinic at [facility] at [phone] during business hours should I have any further questions or need assistance in obtaining follow-up care.    __________________________________________  _____________  __________ Signature of Patient or Authorized Representative            Date                   Time    __________________________________________ Nurse's Signature

## 2011-01-29 ENCOUNTER — Ambulatory Visit (HOSPITAL_COMMUNITY)
Admission: RE | Admit: 2011-01-29 | Discharge: 2011-01-29 | Disposition: A | Discharge status: Home / Self Care | Payer: Medicare Other | Source: Ambulatory Visit | Attending: Oncology | Admitting: Oncology

## 2011-01-29 ENCOUNTER — Other Ambulatory Visit (HOSPITAL_COMMUNITY): Payer: Medicare Other

## 2011-01-29 DIAGNOSIS — C61 Malignant neoplasm of prostate: Secondary | ICD-10-CM

## 2011-01-29 DIAGNOSIS — M899 Disorder of bone, unspecified: Secondary | ICD-10-CM | POA: Insufficient documentation

## 2011-01-29 DIAGNOSIS — C779 Secondary and unspecified malignant neoplasm of lymph node, unspecified: Secondary | ICD-10-CM | POA: Insufficient documentation

## 2011-02-02 ENCOUNTER — Other Ambulatory Visit (HOSPITAL_COMMUNITY): Payer: Self-pay | Admitting: Oncology

## 2011-02-02 DIAGNOSIS — M8580 Other specified disorders of bone density and structure, unspecified site: Secondary | ICD-10-CM

## 2011-02-02 NOTE — Progress Notes (Signed)
This office note has been dictated.

## 2011-02-02 NOTE — Progress Notes (Signed)
CC:   Dr. Faye Ramsay and I just got off the phone after discussing his bone density which I have looked at.  He has had a decrease in his AP spine bone density that they felt was significant.  He is taking Fosamax weekly and he is also on calcium 1200 mg a day and 600 units of vitamin D.  We had it down as having 1000 units, but he is only taking 600, so what he and I talked about doing is leaving him on the Fosamax, leaving him on the same dose of calcium, and increasing his vitamin D to 2000 units a day, and then repeating a bone density in a year.  Rather than go to IV therapy with denosumab (Prolia) or any other therapy, we will just see what he is like in 12 more months and he is very agreeable to this.  He is not symptomatic, has not had any fractures.    ______________________________ Ladona Horns. Mariel Sleet, MD ESN/MEDQ  D:  02/02/2011  T:  02/02/2011  Job:  [id number]

## 2011-02-22 ENCOUNTER — Ambulatory Visit (HOSPITAL_COMMUNITY): Payer: Medicare Other

## 2011-02-22 ENCOUNTER — Encounter (HOSPITAL_COMMUNITY): Payer: Medicare Other

## 2011-03-23 ENCOUNTER — Encounter (HOSPITAL_COMMUNITY): Payer: Medicare Other | Attending: Oncology

## 2011-03-23 DIAGNOSIS — C61 Malignant neoplasm of prostate: Secondary | ICD-10-CM | POA: Insufficient documentation

## 2011-03-23 DIAGNOSIS — C801 Malignant (primary) neoplasm, unspecified: Secondary | ICD-10-CM | POA: Insufficient documentation

## 2011-03-23 DIAGNOSIS — R21 Rash and other nonspecific skin eruption: Secondary | ICD-10-CM | POA: Insufficient documentation

## 2011-03-23 LAB — PSA: PSA: 0.94 ng/mL (ref <=4.00)

## 2011-03-23 NOTE — Progress Notes (Signed)
Labs drawn today for psa 

## 2011-03-26 ENCOUNTER — Telehealth (HOSPITAL_COMMUNITY): Payer: Self-pay | Admitting: *Deleted

## 2011-03-26 NOTE — Telephone Encounter (Signed)
Spoke with pt pleased with results.

## 2011-03-26 NOTE — Telephone Encounter (Signed)
Message copied by Dennie Maizes on Mon Mar 26, 2011 10:25 AM ------      Message from: Mariel Sleet, ERIC S      Created: Mon Mar 26, 2011  9:18 AM       Call [facility] PSA sl. Lower!      Same RX

## 2011-03-27 ENCOUNTER — Encounter (HOSPITAL_BASED_OUTPATIENT_CLINIC_OR_DEPARTMENT_OTHER): Payer: Medicare Other | Admitting: Oncology

## 2011-03-27 VITALS — BP 153/87 | HR 57 | Temp 97.9°F | Wt 206.6 lb

## 2011-03-27 DIAGNOSIS — C61 Malignant neoplasm of prostate: Secondary | ICD-10-CM

## 2011-03-27 DIAGNOSIS — R21 Rash and other nonspecific skin eruption: Secondary | ICD-10-CM

## 2011-03-27 DIAGNOSIS — L259 Unspecified contact dermatitis, unspecified cause: Secondary | ICD-10-CM

## 2011-03-27 DIAGNOSIS — R209 Unspecified disturbances of skin sensation: Secondary | ICD-10-CM

## 2011-03-27 LAB — BASIC METABOLIC PANEL
CO2: 34 mEq/L — ABNORMAL HIGH (ref 19–32)
Calcium: 10.5 mg/dL (ref 8.4–10.5)
Creatinine, Ser: 1.18 mg/dL (ref 0.50–1.35)
GFR calc Af Amer: 68 mL/min — ABNORMAL LOW (ref >90)
GFR calc non Af Amer: 59 mL/min — ABNORMAL LOW (ref >90)
Sodium: 140 mEq/L (ref 135–145)

## 2011-03-27 MED ORDER — CLOTRIMAZOLE-BETAMETHASONE 1-0.05 % EX CREA: TOPICAL_CREAM | CUTANEOUS | Status: DC

## 2011-03-27 NOTE — Progress Notes (Signed)
The patient to go over his labs which show very nice to his PSA level and actually slight diminution. He has 2 complaints today one is a rash on his left ankle and lower leg and the other is tenderness of his left breast in the nipple areolar area. His breasts is not really show gynecomastia on either side but the nipple areola complex is very tender. 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  The rash on his left leg is due to eczema on top of very dry skin or a dermatophyte infection in addition to the dry skin. Some areas clearly are suggestive of fungal disease. I will therefore treat those areas only with Lotrisone cream twice a day for 14 days and then he will call me.  We will check a BMET today because of his hypokalemia in January. He is almost out of his potassium pills.  We will continue checking his PSA level every 8 weeks and I will see him in 16 weeks. He will continue his bone supportive therapy next month.

## 2011-03-27 NOTE — Patient Instructions (Signed)
Shane Hall  [id number] May 24, 1934   [facility]  Discharge Instructions  RECOMMENDATIONS MADE BY THE CONSULTANT AND ANY TEST RESULTS WILL BE SENT TO YOUR REFERRING DOCTOR.   EXAM FINDINGS BY MD TODAY AND SIGNS AND SYMPTOMS TO REPORT TO CLINIC OR PRIMARY MD: You are doing well.  Labs are stable.  MEDICATIONS PRESCRIBED: lotrisone cream - apply to areas on left leg twice daily.  Let us know how the area looks in 14 days.   INSTRUCTIONS GIVEN AND DISCUSSED: Other :  Report shortness of breath or unusual bone pain.  SPECIAL INSTRUCTIONS/FOLLOW-UP: Lab work Needed PSA every 8 weeks and Return to Clinic in 4 months to see MD.  I acknowledge that I have been informed and understand all the instructions given to me and received a copy. I do not have any more questions at this time, but understand that I may call the Specialty Clinic at [facility] at [phone] during business hours should I have any further questions or need assistance in obtaining follow-up care.    __________________________________________  _____________  __________ Signature of Patient or Authorized Representative            Date                   Time    __________________________________________ Nurse's Signature

## 2011-04-17 ENCOUNTER — Encounter (HOSPITAL_COMMUNITY): Payer: Medicare Other | Attending: Oncology

## 2011-04-17 DIAGNOSIS — C801 Malignant (primary) neoplasm, unspecified: Secondary | ICD-10-CM

## 2011-04-17 DIAGNOSIS — C61 Malignant neoplasm of prostate: Secondary | ICD-10-CM | POA: Insufficient documentation

## 2011-04-17 LAB — COMPREHENSIVE METABOLIC PANEL
ALT: 18 U/L (ref 0–53)
AST: 23 U/L (ref 0–37)
Alkaline Phosphatase: 59 U/L (ref 39–117)
CO2: 34 mEq/L — ABNORMAL HIGH (ref 19–32)
Calcium: 10.5 mg/dL (ref 8.4–10.5)
Chloride: 100 mEq/L (ref 96–112)
GFR calc Af Amer: 59 mL/min — ABNORMAL LOW (ref >90)
GFR calc non Af Amer: 51 mL/min — ABNORMAL LOW (ref >90)
Glucose, Bld: 107 mg/dL — ABNORMAL HIGH (ref 70–99)
Sodium: 143 mEq/L (ref 135–145)
Total Bilirubin: 0.5 mg/dL (ref 0.3–1.2)

## 2011-04-18 NOTE — Progress Notes (Signed)
Lab draw

## 2011-04-23 ENCOUNTER — Other Ambulatory Visit (HOSPITAL_COMMUNITY): Payer: Self-pay | Admitting: Oncology

## 2011-04-23 ENCOUNTER — Encounter (HOSPITAL_COMMUNITY): Payer: Self-pay | Admitting: Oncology

## 2011-04-23 ENCOUNTER — Encounter (HOSPITAL_BASED_OUTPATIENT_CLINIC_OR_DEPARTMENT_OTHER): Payer: Medicare Other

## 2011-04-23 VITALS — BP 185/80 | HR 57

## 2011-04-23 DIAGNOSIS — C61 Malignant neoplasm of prostate: Secondary | ICD-10-CM

## 2011-04-23 HISTORY — DX: Malignant neoplasm of prostate: C61

## 2011-04-23 MED ORDER — TRIPTORELIN PAMOATE 22.5 MG IM SUSR
22.5000 mg | Freq: Once | INTRAMUSCULAR | Status: AC
  Administered 2011-04-23: 22.5 mg via INTRAMUSCULAR
  Filled 2011-04-23: qty 45

## 2011-04-23 NOTE — Progress Notes (Signed)
Shane Hall presents today for injection per MD orders. trelstar 22.5 mg administered IM in right Gluteal. Administration without incident. Patient tolerated well.  

## 2011-05-14 ENCOUNTER — Other Ambulatory Visit (HOSPITAL_COMMUNITY): Payer: Self-pay | Admitting: Oncology

## 2011-05-14 DIAGNOSIS — C61 Malignant neoplasm of prostate: Secondary | ICD-10-CM

## 2011-05-14 MED ORDER — BICALUTAMIDE 50 MG PO TABS: 50.0000 mg | ORAL_TABLET | Freq: Every day | ORAL | Status: DC

## 2011-05-17 ENCOUNTER — Telehealth (HOSPITAL_COMMUNITY): Payer: Self-pay | Admitting: *Deleted

## 2011-05-17 ENCOUNTER — Other Ambulatory Visit (HOSPITAL_COMMUNITY): Payer: Self-pay | Admitting: Oncology

## 2011-05-17 DIAGNOSIS — C61 Malignant neoplasm of prostate: Secondary | ICD-10-CM

## 2011-05-17 MED ORDER — ALENDRONATE SODIUM 35 MG PO TABS: 35.0000 mg | ORAL_TABLET | ORAL | Status: DC

## 2011-05-17 MED ORDER — POTASSIUM CHLORIDE CRYS ER 20 MEQ PO TBCR: 20.0000 meq | EXTENDED_RELEASE_TABLET | Freq: Two times a day (BID) | ORAL | Status: DC

## 2011-05-17 NOTE — Telephone Encounter (Signed)
Verified with pt that dose is 35mg  weekly and e-scribed to [facility] pharmacy

## 2011-05-22 ENCOUNTER — Other Ambulatory Visit (HOSPITAL_COMMUNITY): Payer: Medicare Other

## 2011-05-25 ENCOUNTER — Encounter (HOSPITAL_COMMUNITY): Payer: Medicare Other | Attending: Oncology

## 2011-05-25 DIAGNOSIS — C61 Malignant neoplasm of prostate: Secondary | ICD-10-CM | POA: Insufficient documentation

## 2011-07-09 ENCOUNTER — Other Ambulatory Visit (HOSPITAL_COMMUNITY): Payer: Self-pay | Admitting: Pediatrics

## 2011-07-09 ENCOUNTER — Ambulatory Visit (HOSPITAL_COMMUNITY)
Admission: RE | Admit: 2011-07-09 | Discharge: 2011-07-09 | Disposition: A | Discharge status: Home / Self Care | Payer: Medicare Other | Source: Ambulatory Visit | Attending: Pediatrics | Admitting: Pediatrics

## 2011-07-09 DIAGNOSIS — R0600 Dyspnea, unspecified: Secondary | ICD-10-CM

## 2011-07-09 DIAGNOSIS — R0602 Shortness of breath: Secondary | ICD-10-CM | POA: Insufficient documentation

## 2011-07-17 ENCOUNTER — Encounter (HOSPITAL_COMMUNITY): Payer: Medicare Other | Attending: Oncology

## 2011-07-17 DIAGNOSIS — C61 Malignant neoplasm of prostate: Secondary | ICD-10-CM | POA: Insufficient documentation

## 2011-07-17 DIAGNOSIS — E876 Hypokalemia: Secondary | ICD-10-CM | POA: Insufficient documentation

## 2011-07-17 NOTE — Progress Notes (Signed)
Labs drawn today for psa 

## 2011-07-18 ENCOUNTER — Encounter (HOSPITAL_BASED_OUTPATIENT_CLINIC_OR_DEPARTMENT_OTHER): Payer: Medicare Other | Admitting: Oncology

## 2011-07-18 VITALS — BP 137/69 | HR 51 | Temp 97.9°F | Wt 204.3 lb

## 2011-07-18 DIAGNOSIS — E876 Hypokalemia: Secondary | ICD-10-CM

## 2011-07-18 DIAGNOSIS — M79609 Pain in unspecified limb: Secondary | ICD-10-CM

## 2011-07-18 DIAGNOSIS — C61 Malignant neoplasm of prostate: Secondary | ICD-10-CM

## 2011-07-18 NOTE — Progress Notes (Signed)
Problem #1 recurrent prostate carcinoma now on Depo-Lupron and Casodex. PSAs are stable and therefore we will continue the same therapy until things change.  His dermatophyte infection went away with the Lotrisone cream.  His new problem is pain in his lower legs below the calf level which prevents him from exercising as much as he would like. There is discomfort no redness but he gets stiff after sitting down. He is on a treadmill 30 minutes at a time several days a week which is when he gets worse. His exam shows no increased heat in the lower leg or foot bilaterally with no cords no redness no tenderness presently. He has minimal puffiness of both lower legs and feet. Pulses are still good. I will therefore get Dopplers to make sure there is no clot in either leg and if negative proceed with a physical therapy consultation for stretching and exercises. We will continue otherwise his therapy for his prostate cancer and I will see him in 3 months

## 2011-07-18 NOTE — Patient Instructions (Addendum)
Shane Hall  [id number] [birth year] Dr. Glenford Peers   [facility]  Discharge Instructions  RECOMMENDATIONS MADE BY THE CONSULTANT AND ANY TEST RESULTS WILL BE SENT TO YOUR REFERRING DOCTOR.   EXAM FINDINGS BY MD TODAY AND SIGNS AND SYMPTOMS TO REPORT TO CLINIC OR PRIMARY MD: Exam and discussion per MD.  Need to do doppler studies of your lower leg to make sure you don't have a clot.  If study is negative we will get you set up for Physical Therapy for stretching and strengthening exercises.  Report any new lumps, bone pain or shortness of breath.  MEDICATIONS PRESCRIBED: none   INSTRUCTIONS GIVEN AND DISCUSSED: Other :  Will continue PSA every 8 weeks and Lupron injections every 6 months.  SPECIAL INSTRUCTIONS/FOLLOW-UP: Lab work Needed PSA in 8 weeks, Xray Studies Needed doppler study of lower leg and Return to Clinic in 3 months to see MD.   I acknowledge that I have been informed and understand all the instructions given to me and received a copy. I do not have any more questions at this time, but understand that I may call the Specialty Clinic at [facility] at [phone] during business hours should I have any further questions or need assistance in obtaining follow-up care.    __________________________________________  _____________  __________ Signature of Patient or Authorized Representative            Date                   Time    __________________________________________ Nurse's Signature

## 2011-07-19 ENCOUNTER — Ambulatory Visit (HOSPITAL_COMMUNITY)
Admission: RE | Admit: 2011-07-19 | Discharge: 2011-07-19 | Disposition: A | Discharge status: Home / Self Care | Payer: Medicare Other | Source: Ambulatory Visit | Attending: Oncology | Admitting: Oncology

## 2011-07-19 DIAGNOSIS — C61 Malignant neoplasm of prostate: Secondary | ICD-10-CM | POA: Insufficient documentation

## 2011-07-19 DIAGNOSIS — M7989 Other specified soft tissue disorders: Secondary | ICD-10-CM | POA: Insufficient documentation

## 2011-07-19 DIAGNOSIS — M79609 Pain in unspecified limb: Secondary | ICD-10-CM | POA: Insufficient documentation

## 2011-07-23 ENCOUNTER — Ambulatory Visit (HOSPITAL_COMMUNITY): Payer: Medicare Other

## 2011-07-30 ENCOUNTER — Ambulatory Visit (HOSPITAL_COMMUNITY): Payer: Medicare Other

## 2011-09-11 ENCOUNTER — Other Ambulatory Visit (HOSPITAL_COMMUNITY): Payer: Self-pay | Admitting: Oncology

## 2011-09-11 ENCOUNTER — Encounter (HOSPITAL_COMMUNITY): Payer: Medicare Other | Attending: Oncology

## 2011-09-11 DIAGNOSIS — C61 Malignant neoplasm of prostate: Secondary | ICD-10-CM

## 2011-09-11 DIAGNOSIS — E876 Hypokalemia: Secondary | ICD-10-CM

## 2011-09-11 LAB — PSA: PSA: 1.5 ng/mL (ref <=4.00)

## 2011-09-11 LAB — BASIC METABOLIC PANEL
Chloride: 98 mEq/L (ref 96–112)
Creatinine, Ser: 1.13 mg/dL (ref 0.50–1.35)
GFR calc Af Amer: 71 mL/min — ABNORMAL LOW (ref >90)

## 2011-09-11 LAB — MAGNESIUM: Magnesium: 2.1 mg/dL (ref 1.5–2.5)

## 2011-09-11 NOTE — Progress Notes (Signed)
Labs drawn today for bmp,psa 

## 2011-09-17 ENCOUNTER — Other Ambulatory Visit (HOSPITAL_COMMUNITY): Payer: Self-pay | Admitting: Oncology

## 2011-09-17 DIAGNOSIS — C61 Malignant neoplasm of prostate: Secondary | ICD-10-CM

## 2011-09-17 MED ORDER — BICALUTAMIDE 50 MG PO TABS: 50.0000 mg | ORAL_TABLET | Freq: Every day | ORAL | Status: DC

## 2011-09-27 ENCOUNTER — Other Ambulatory Visit (HOSPITAL_COMMUNITY): Payer: Self-pay | Admitting: Oncology

## 2011-09-27 DIAGNOSIS — C61 Malignant neoplasm of prostate: Secondary | ICD-10-CM

## 2011-09-27 MED ORDER — ALENDRONATE SODIUM 35 MG PO TABS: 35.0000 mg | ORAL_TABLET | ORAL | Status: DC

## 2011-10-10 ENCOUNTER — Ambulatory Visit (HOSPITAL_COMMUNITY): Payer: Medicare Other | Admitting: Oncology

## 2011-10-17 ENCOUNTER — Encounter (HOSPITAL_COMMUNITY): Payer: Medicare Other | Attending: Oncology | Admitting: Oncology

## 2011-10-17 VITALS — BP 158/79 | HR 55 | Temp 97.7°F | Resp 20 | Wt 205.9 lb

## 2011-10-17 DIAGNOSIS — N529 Male erectile dysfunction, unspecified: Secondary | ICD-10-CM

## 2011-10-17 DIAGNOSIS — C61 Malignant neoplasm of prostate: Secondary | ICD-10-CM | POA: Insufficient documentation

## 2011-10-17 LAB — CBC WITH DIFFERENTIAL/PLATELET
Hemoglobin: 14.6 g/dL (ref 13.0–17.0)
Lymphocytes Relative: 26 % (ref 12–46)
Lymphs Abs: 2.3 10*3/uL (ref 0.7–4.0)
MCV: 95.8 fL (ref 78.0–100.0)
Neutrophils Relative %: 63 % (ref 43–77)
Platelets: 234 10*3/uL (ref 150–400)
RBC: 4.48 MIL/uL (ref 4.22–5.81)
WBC: 8.7 10*3/uL (ref 4.0–10.5)

## 2011-10-17 LAB — COMPREHENSIVE METABOLIC PANEL
ALT: 21 U/L (ref 0–53)
Alkaline Phosphatase: 59 U/L (ref 39–117)
CO2: 33 mEq/L — ABNORMAL HIGH (ref 19–32)
GFR calc Af Amer: 65 mL/min — ABNORMAL LOW (ref >90)
GFR calc non Af Amer: 56 mL/min — ABNORMAL LOW (ref >90)
Glucose, Bld: 93 mg/dL (ref 70–99)
Potassium: 2.9 mEq/L — ABNORMAL LOW (ref 3.5–5.1)
Sodium: 138 mEq/L (ref 135–145)
Total Bilirubin: 0.6 mg/dL (ref 0.3–1.2)

## 2011-10-17 MED ORDER — TRIPTORELIN PAMOATE 22.5 MG IM SUSR
22.5000 mg | Freq: Once | INTRAMUSCULAR | Status: AC
  Administered 2011-10-17: 22.5 mg via INTRAMUSCULAR
  Filled 2011-10-17: qty 22.5

## 2011-10-17 NOTE — Progress Notes (Signed)
Diagnoses #1 recurrent prostate cancer, with biochemical recurrence thus far only. He is on Trelstar and bicalutamide but his PSA has risen slightly. We're going to check it today in 4 weeks and 8 weeks and then decide whether to repeat his bone scan and CAT scans in December or January. If he has any definite progression we will change the bicalutamide to one of the other new agents. I do not think I would use the prostate vaccine drug.  He feels good his only complaint is that he has impotence. He has some arthritis and is using 9 Golden raisins soaked in gin daily. Physical exam shows stable vital signs. He is no swelling of his legs but is very dry skin. He has no adenopathy. Lungs are clear. Heart shows a regular rhythm and rate without murmur rub or gallop. Abdomen is soft and nontender without organomegaly. He did have a skin fungal infection in the past successfully treated with Lotrisone.  I will see him in 8 weeks right after his PSA level. We'll check his potassium is well today since he was hypokalemic the last time we saw him and he has been taking his potassium. He is on blood pressure to the diuretic most likely the cause of his hypokalemia. He is not having diarrhea etc.

## 2011-10-17 NOTE — Progress Notes (Signed)
Shane Hall presents today for injection per MD orders. trelstar 22.5 mg (6 month dose) administered IM in left Gluteal. Administration without incident. Patient tolerated well. Shane Hall presented for labwork. Labs per MD order drawn via Peripheral Line 23 gauge needle inserted in lt ac.  Good blood return present. Procedure without incident.  Needle removed intact. Patient tolerated procedure well.

## 2011-10-19 ENCOUNTER — Ambulatory Visit (HOSPITAL_COMMUNITY): Payer: Medicare Other | Admitting: Oncology

## 2011-10-22 ENCOUNTER — Telehealth (HOSPITAL_COMMUNITY): Payer: Self-pay

## 2011-10-22 NOTE — Telephone Encounter (Signed)
Message copied by Evelena Leyden on Mon Oct 22, 2011  3:55 PM ------      Message from: Mariel Sleet, ERIC S      Created: Mon Oct 22, 2011  1:51 PM       K+ very low -is he taking any??

## 2011-10-23 ENCOUNTER — Other Ambulatory Visit (HOSPITAL_COMMUNITY): Payer: Medicare Other

## 2011-10-23 ENCOUNTER — Ambulatory Visit (HOSPITAL_COMMUNITY): Payer: Medicare Other

## 2011-11-06 ENCOUNTER — Encounter (HOSPITAL_COMMUNITY): Payer: Medicare Other | Attending: Oncology

## 2011-11-06 DIAGNOSIS — C61 Malignant neoplasm of prostate: Secondary | ICD-10-CM | POA: Insufficient documentation

## 2011-11-06 NOTE — Progress Notes (Signed)
Labs drawn today for psa 

## 2011-11-06 NOTE — Addendum Note (Signed)
Addended byMarica Otter J on: 11/06/2011 11:11 AM   Modules accepted: Orders

## 2011-11-20 ENCOUNTER — Encounter (HOSPITAL_BASED_OUTPATIENT_CLINIC_OR_DEPARTMENT_OTHER): Payer: Medicare Other

## 2011-11-20 DIAGNOSIS — C61 Malignant neoplasm of prostate: Secondary | ICD-10-CM

## 2011-11-20 NOTE — Progress Notes (Signed)
Labs drawn today for psa 

## 2011-11-21 ENCOUNTER — Other Ambulatory Visit (HOSPITAL_COMMUNITY): Payer: Self-pay

## 2011-11-21 DIAGNOSIS — C61 Malignant neoplasm of prostate: Secondary | ICD-10-CM

## 2011-12-03 ENCOUNTER — Other Ambulatory Visit (HOSPITAL_COMMUNITY): Payer: Self-pay | Admitting: Oncology

## 2011-12-03 DIAGNOSIS — C61 Malignant neoplasm of prostate: Secondary | ICD-10-CM

## 2011-12-03 MED ORDER — POTASSIUM CHLORIDE CRYS ER 20 MEQ PO TBCR: 20.0000 meq | EXTENDED_RELEASE_TABLET | Freq: Three times a day (TID) | ORAL | Status: DC

## 2011-12-18 ENCOUNTER — Encounter (HOSPITAL_COMMUNITY): Payer: Medicare Other | Attending: Oncology

## 2011-12-18 DIAGNOSIS — C61 Malignant neoplasm of prostate: Secondary | ICD-10-CM | POA: Insufficient documentation

## 2011-12-18 DIAGNOSIS — E876 Hypokalemia: Secondary | ICD-10-CM | POA: Insufficient documentation

## 2011-12-18 LAB — COMPREHENSIVE METABOLIC PANEL
AST: 24 U/L (ref 0–37)
CO2: 32 mEq/L (ref 19–32)
Calcium: 10.1 mg/dL (ref 8.4–10.5)
Creatinine, Ser: 1.19 mg/dL (ref 0.50–1.35)
GFR calc non Af Amer: 58 mL/min — ABNORMAL LOW (ref >90)

## 2011-12-18 LAB — CBC WITH DIFFERENTIAL/PLATELET
Basophils Absolute: 0 10*3/uL (ref 0.0–0.1)
Basophils Relative: 0 % (ref 0–1)
Eosinophils Relative: 4 % (ref 0–5)
HCT: 40.8 % (ref 39.0–52.0)
Lymphocytes Relative: 32 % (ref 12–46)
MCHC: 34.3 g/dL (ref 30.0–36.0)
MCV: 96.2 fL (ref 78.0–100.0)
Monocytes Absolute: 0.4 10*3/uL (ref 0.1–1.0)
RDW: 13.2 % (ref 11.5–15.5)

## 2011-12-18 NOTE — Progress Notes (Signed)
Labs drawn today for cbc/diff,cmp,psa 

## 2011-12-21 ENCOUNTER — Encounter (HOSPITAL_COMMUNITY): Payer: Self-pay | Admitting: Oncology

## 2011-12-21 ENCOUNTER — Encounter (HOSPITAL_BASED_OUTPATIENT_CLINIC_OR_DEPARTMENT_OTHER): Payer: Medicare Other | Admitting: Oncology

## 2011-12-21 VITALS — BP 145/83 | HR 58 | Temp 97.9°F | Resp 16 | Wt 203.7 lb

## 2011-12-21 DIAGNOSIS — G609 Hereditary and idiopathic neuropathy, unspecified: Secondary | ICD-10-CM

## 2011-12-21 DIAGNOSIS — L851 Acquired keratosis [keratoderma] palmaris et plantaris: Secondary | ICD-10-CM

## 2011-12-21 DIAGNOSIS — E876 Hypokalemia: Secondary | ICD-10-CM

## 2011-12-21 DIAGNOSIS — C61 Malignant neoplasm of prostate: Secondary | ICD-10-CM

## 2011-12-21 MED ORDER — POTASSIUM CHLORIDE CRYS ER 20 MEQ PO TBCR: 20.0000 meq | EXTENDED_RELEASE_TABLET | Freq: Three times a day (TID) | ORAL | Status: DC

## 2011-12-21 NOTE — Progress Notes (Signed)
Problem number 1 recurrent prostate cancer with biochemical recurrence thus far only. He is on Trelstar and bicalutamide. His PSA has risen slightly but has stabilized. He is not symptomatic from this disease that I can appreciate. Problem #2 dry skin and we are going to try Cetaphil cream twice a day for a month Problem #3 peripheral neuropathy symptomatically present when he stands of his feet are tried exercise. He tried low dose gabapentin in the past with no success. It appears that he was only on 100 mg 3 times a day. He does not want to try that again at higher doses nor amitriptyline just yet.  His vital signs really quite stable. His dry skin is more obvious on the left leg than the right. I do not think that he has recurrence of his dermatophyte infection which was present in addition to the dry skin. He has no swelling but he has diminished to absent pulses in his feet. He was not a smoker. He is not a diabetic. 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  He is accompanied by his wife today. The plan is to continue present therapy as were doing. If his PSA rises any further I will consider a bone scan the right I do not think we need to pursue that since he is stable.

## 2011-12-21 NOTE — Patient Instructions (Addendum)
[facility] Discharge Instructions  RECOMMENDATIONS MADE BY THE CONSULTANT AND ANY TEST RESULTS WILL BE SENT TO YOUR REFERRING PHYSICIAN.  EXAM FINDINGS BY THE PHYSICIAN TODAY AND SIGNS OR SYMPTOMS TO REPORT TO CLINIC OR PRIMARY PHYSICIAN: exam and discussion by MD.  No changes.  Will continue with monthly blood work.  MEDICATIONS PRESCRIBED:  Continue Potassium 1 pill three times daily.  INSTRUCTIONS GIVEN AND DISCUSSED: Report unusual bone pain or shortness of breath.  SPECIAL INSTRUCTIONS/FOLLOW-UP: Monthly for blood work and in 3 months to se MD.  Thank you for choosing [facility] to provide your oncology and hematology care.  To afford each patient quality time with our providers, please arrive at least 15 minutes before your scheduled appointment time.  With your help, our goal is to use those 15 minutes to complete the necessary work-up to ensure our physicians have the information they need to help with your evaluation and healthcare recommendations.    Effective January 1st, 2014, we ask that you re-schedule your appointment with our physicians should you arrive 10 or more minutes late for your appointment.  We strive to give you quality time with our providers, and arriving late affects you and other patients whose appointments are after yours.    Again, thank you for choosing [facility].  Our hope is that these requests will decrease the amount of time that you wait before being seen by our physicians.       _____________________________________________________________  I acknowledge that I have been informed and understand all the instructions given to me and received a copy. I do not have anymore questions at this time but understand that I may call the Cancer Center at [facility] at [phone] during business hours should I have any further questions or need assistance in obtaining follow-up  care.    __________________________________________  _____________  __________ Signature of Patient or Authorized Representative            Date                   Time    __________________________________________ Nurse's Signature

## 2012-01-16 ENCOUNTER — Encounter (HOSPITAL_COMMUNITY): Payer: Medicare Other | Attending: Oncology

## 2012-01-16 ENCOUNTER — Other Ambulatory Visit (HOSPITAL_COMMUNITY): Payer: Self-pay | Admitting: Oncology

## 2012-01-16 DIAGNOSIS — C61 Malignant neoplasm of prostate: Secondary | ICD-10-CM

## 2012-01-16 LAB — PSA: PSA: 2.09 ng/mL (ref <=4.00)

## 2012-01-16 MED ORDER — BICALUTAMIDE 50 MG PO TABS: 50.0000 mg | ORAL_TABLET | Freq: Every day | ORAL | Status: DC

## 2012-01-16 NOTE — Progress Notes (Signed)
Labs drawn today for psa 

## 2012-01-21 ENCOUNTER — Other Ambulatory Visit (HOSPITAL_COMMUNITY): Payer: Self-pay | Admitting: Oncology

## 2012-01-21 DIAGNOSIS — C61 Malignant neoplasm of prostate: Secondary | ICD-10-CM

## 2012-01-21 MED ORDER — ALENDRONATE SODIUM 35 MG PO TABS: 35.0000 mg | ORAL_TABLET | ORAL | Status: DC

## 2012-01-30 ENCOUNTER — Other Ambulatory Visit (HOSPITAL_COMMUNITY): Payer: Medicare Other

## 2012-01-30 ENCOUNTER — Ambulatory Visit (HOSPITAL_COMMUNITY)
Admission: RE | Admit: 2012-01-30 | Discharge: 2012-01-30 | Disposition: A | Discharge status: Home / Self Care | Payer: Medicare Other | Source: Ambulatory Visit | Attending: Oncology | Admitting: Oncology

## 2012-01-30 DIAGNOSIS — M949 Disorder of cartilage, unspecified: Secondary | ICD-10-CM | POA: Insufficient documentation

## 2012-01-30 DIAGNOSIS — M8580 Other specified disorders of bone density and structure, unspecified site: Secondary | ICD-10-CM

## 2012-01-30 DIAGNOSIS — M899 Disorder of bone, unspecified: Secondary | ICD-10-CM | POA: Insufficient documentation

## 2012-01-30 DIAGNOSIS — C61 Malignant neoplasm of prostate: Secondary | ICD-10-CM | POA: Insufficient documentation

## 2012-02-05 ENCOUNTER — Telehealth (HOSPITAL_COMMUNITY): Payer: Self-pay

## 2012-02-05 NOTE — Telephone Encounter (Signed)
error 

## 2012-02-13 ENCOUNTER — Encounter (HOSPITAL_COMMUNITY): Payer: Medicare Other | Attending: Oncology

## 2012-02-13 DIAGNOSIS — C61 Malignant neoplasm of prostate: Secondary | ICD-10-CM | POA: Insufficient documentation

## 2012-02-13 NOTE — Progress Notes (Signed)
Labs drawn today for psa 

## 2012-03-02 ENCOUNTER — Observation Stay (HOSPITAL_COMMUNITY)
Admission: AD | Admit: 2012-03-02 | Discharge: 2012-03-03 | Disposition: A | Discharge status: Home / Self Care | Payer: Medicare Other | Attending: Internal Medicine | Admitting: Internal Medicine

## 2012-03-02 ENCOUNTER — Observation Stay (HOSPITAL_COMMUNITY): Payer: Medicare Other

## 2012-03-02 ENCOUNTER — Encounter (HOSPITAL_COMMUNITY): Payer: Self-pay | Admitting: *Deleted

## 2012-03-02 DIAGNOSIS — K5641 Fecal impaction: Principal | ICD-10-CM

## 2012-03-02 DIAGNOSIS — I10 Essential (primary) hypertension: Secondary | ICD-10-CM

## 2012-03-02 DIAGNOSIS — C61 Malignant neoplasm of prostate: Secondary | ICD-10-CM | POA: Insufficient documentation

## 2012-03-02 DIAGNOSIS — H409 Unspecified glaucoma: Secondary | ICD-10-CM

## 2012-03-02 DIAGNOSIS — E039 Hypothyroidism, unspecified: Secondary | ICD-10-CM

## 2012-03-02 DIAGNOSIS — Z23 Encounter for immunization: Secondary | ICD-10-CM

## 2012-03-02 DIAGNOSIS — K644 Residual hemorrhoidal skin tags: Secondary | ICD-10-CM | POA: Insufficient documentation

## 2012-03-02 DIAGNOSIS — E876 Hypokalemia: Secondary | ICD-10-CM

## 2012-03-02 DIAGNOSIS — E785 Hyperlipidemia, unspecified: Secondary | ICD-10-CM | POA: Diagnosis present

## 2012-03-02 LAB — BASIC METABOLIC PANEL
BUN: 22 mg/dL (ref 6–23)
CO2: 27 mEq/L (ref 19–32)
Calcium: 9.7 mg/dL (ref 8.4–10.5)
Creatinine, Ser: 1.2 mg/dL (ref 0.50–1.35)
GFR calc Af Amer: 66 mL/min — ABNORMAL LOW (ref >90)
GFR calc non Af Amer: 57 mL/min — ABNORMAL LOW (ref >90)

## 2012-03-02 LAB — CBC
HCT: 39.4 % (ref 39.0–52.0)
MCHC: 35.3 g/dL (ref 30.0–36.0)
MCV: 94 fL (ref 78.0–100.0)
RDW: 13.3 % (ref 11.5–15.5)

## 2012-03-02 LAB — TSH: TSH: 2.1 u[IU]/mL (ref 0.350–4.500)

## 2012-03-02 MED ORDER — ALUM & MAG HYDROXIDE-SIMETH 200-200-20 MG/5ML PO SUSP: 30.0000 mL | Freq: Four times a day (QID) | ORAL | Status: DC | PRN

## 2012-03-02 MED ORDER — ASPIRIN EC 81 MG PO TBEC: 81.0000 mg | DELAYED_RELEASE_TABLET | Freq: Every day | ORAL | Status: DC

## 2012-03-02 MED ORDER — ADULT MULTIVITAMIN W/MINERALS CH
1.0000 | ORAL_TABLET | Freq: Every day | ORAL | Status: DC
  Administered 2012-03-03: 1 via ORAL
  Filled 2012-03-02 (×2): qty 1

## 2012-03-02 MED ORDER — SIMVASTATIN 10 MG PO TABS: 10.0000 mg | ORAL_TABLET | Freq: Every day | ORAL | Status: DC

## 2012-03-02 MED ORDER — TIMOLOL MALEATE 0.5 % OP SOLN
1.0000 [drp] | Freq: Two times a day (BID) | OPHTHALMIC | Status: DC
  Administered 2012-03-02 – 2012-03-03 (×2): 1 [drp] via OPHTHALMIC
  Filled 2012-03-02: qty 5

## 2012-03-02 MED ORDER — METOPROLOL TARTRATE 50 MG PO TABS
50.0000 mg | ORAL_TABLET | Freq: Two times a day (BID) | ORAL | Status: DC
  Administered 2012-03-02 – 2012-03-03 (×3): 50 mg via ORAL
  Filled 2012-03-02 (×3): qty 1

## 2012-03-02 MED ORDER — NIACIN 250 MG PO TABS
250.0000 mg | ORAL_TABLET | Freq: Every day | ORAL | Status: DC
  Filled 2012-03-02: qty 1

## 2012-03-02 MED ORDER — PNEUMOCOCCAL VAC POLYVALENT 25 MCG/0.5ML IJ INJ
0.5000 mL | INJECTION | INTRAMUSCULAR | Status: AC
  Administered 2012-03-03: 0.5 mL via INTRAMUSCULAR
  Filled 2012-03-02: qty 0.5

## 2012-03-02 MED ORDER — ONE-DAILY MULTI VITAMINS PO TABS
1.0000 | ORAL_TABLET | Freq: Every day | ORAL | Status: DC
  Administered 2012-03-02: 1 via ORAL

## 2012-03-02 MED ORDER — LEVOTHYROXINE SODIUM 112 MCG PO TABS
ORAL_TABLET | ORAL | Status: AC
  Filled 2012-03-02: qty 1

## 2012-03-02 MED ORDER — ONDANSETRON HCL 4 MG/2ML IJ SOLN: 4.0000 mg | Freq: Four times a day (QID) | INTRAMUSCULAR | Status: DC | PRN

## 2012-03-02 MED ORDER — LIDOCAINE HCL 2 % EX GEL
Freq: Once | CUTANEOUS | Status: AC
  Administered 2012-03-02: 17:00:00
  Filled 2012-03-02: qty 5

## 2012-03-02 MED ORDER — POTASSIUM CHLORIDE CRYS ER 20 MEQ PO TBCR
40.0000 meq | EXTENDED_RELEASE_TABLET | Freq: Four times a day (QID) | ORAL | Status: DC
  Administered 2012-03-02 – 2012-03-03 (×3): 40 meq via ORAL
  Filled 2012-03-02 (×2): qty 2

## 2012-03-02 MED ORDER — LEVOTHYROXINE SODIUM 112 MCG PO TABS
112.0000 ug | ORAL_TABLET | Freq: Every day | ORAL | Status: DC
  Administered 2012-03-03: 112 ug via ORAL
  Filled 2012-03-02 (×2): qty 1

## 2012-03-02 MED ORDER — POLYETHYLENE GLYCOL 3350 17 G PO PACK
17.0000 g | PACK | Freq: Every day | ORAL | Status: DC
  Administered 2012-03-02: 17 g via ORAL
  Filled 2012-03-02: qty 1

## 2012-03-02 MED ORDER — ONDANSETRON HCL 4 MG PO TABS: 4.0000 mg | ORAL_TABLET | Freq: Four times a day (QID) | ORAL | Status: DC | PRN

## 2012-03-02 MED ORDER — FLEET ENEMA 7-19 GM/118ML RE ENEM
1.0000 | ENEMA | Freq: Once | RECTAL | Status: AC
  Administered 2012-03-02: 1 via RECTAL

## 2012-03-02 MED ORDER — POTASSIUM CHLORIDE CRYS ER 20 MEQ PO TBCR
20.0000 meq | EXTENDED_RELEASE_TABLET | Freq: Three times a day (TID) | ORAL | Status: DC
  Filled 2012-03-02: qty 1

## 2012-03-02 MED ORDER — MAGNESIUM GLUCONATE 500 MG PO TABS
500.0000 mg | ORAL_TABLET | Freq: Every day | ORAL | Status: DC
  Filled 2012-03-02 (×2): qty 1

## 2012-03-02 MED ORDER — ZOLPIDEM TARTRATE 5 MG PO TABS: 5.0000 mg | ORAL_TABLET | Freq: Every evening | ORAL | Status: DC | PRN

## 2012-03-02 MED ORDER — ASPIRIN 81 MG PO TABS: 81.0000 mg | ORAL_TABLET | Freq: Every day | ORAL | Status: DC

## 2012-03-02 MED ORDER — TIMOLOL HEMIHYDRATE 0.5 % OP SOLN: 1.0000 [drp] | Freq: Two times a day (BID) | OPHTHALMIC | Status: DC

## 2012-03-02 MED ORDER — MILK AND MOLASSES ENEMA: Freq: Once | RECTAL | Status: DC

## 2012-03-02 MED ORDER — LISINOPRIL 10 MG PO TABS
20.0000 mg | ORAL_TABLET | Freq: Two times a day (BID) | ORAL | Status: DC
  Administered 2012-03-02 – 2012-03-03 (×2): 20 mg via ORAL
  Filled 2012-03-02 (×2): qty 2

## 2012-03-02 MED ORDER — BICALUTAMIDE 50 MG PO TABS
50.0000 mg | ORAL_TABLET | Freq: Every day | ORAL | Status: DC
  Filled 2012-03-02 (×3): qty 1

## 2012-03-02 MED ORDER — ACETAMINOPHEN 325 MG PO TABS: 650.0000 mg | ORAL_TABLET | Freq: Four times a day (QID) | ORAL | Status: DC | PRN

## 2012-03-02 MED ORDER — HYDROCODONE-ACETAMINOPHEN 5-325 MG PO TABS: 1.0000 | ORAL_TABLET | ORAL | Status: DC | PRN

## 2012-03-02 MED ORDER — AMLODIPINE BESYLATE 5 MG PO TABS
10.0000 mg | ORAL_TABLET | Freq: Every day | ORAL | Status: DC
  Administered 2012-03-02: 10 mg via ORAL
  Filled 2012-03-02: qty 2

## 2012-03-02 MED ORDER — DOCUSATE SODIUM 100 MG PO CAPS
100.0000 mg | ORAL_CAPSULE | Freq: Every day | ORAL | Status: DC
  Administered 2012-03-02 – 2012-03-03 (×2): 100 mg via ORAL
  Filled 2012-03-02 (×2): qty 1

## 2012-03-02 MED ORDER — ENOXAPARIN SODIUM 40 MG/0.4ML ~~LOC~~ SOLN
40.0000 mg | SUBCUTANEOUS | Status: DC
  Filled 2012-03-02: qty 0.4

## 2012-03-02 MED FILL — Multiple Vitamin Tab: ORAL | Qty: 1 | Status: AC

## 2012-03-02 NOTE — H&P (Signed)
Hospital Admission Note Date: 03/02/2012  Patient name: Shane Hall Medical record number: [id number] Date of birth: 13-Aug-1934 Age: 78 y.o. Gender: male PCP: Dwana Melena, MD  Attending physician: Christiane Ha, MD  Chief Complaint: Constipation  History of Present Illness:  Shane Hall is an 78 y.o. male who has a history of constipation. He was seen by Dr. Malvin Johns in the office yesterday and disimpaction was unsuccessfully attempted yesterday. The patient was placed on a bowel regimen by Dr. Malvin Johns. The patient called Dr. Malvin Johns today and was still having problems, so it was requested that the hospitalist directly admit the patient for treatment of his fecal impaction. The patient reports that he attempted to fleets enemas last night but was unable to manage the mechanics of the enema, so it sounds as if he did not receive one. He has been taking MiraLAX. He reports now that he is having frequent loose stools. He has hemorrhoids which have been asked exacerbated. He didn't eat much today or yesterday, but has had no vomiting. He feels no abdominal distention. No fevers. No history of abdominal surgery. No abdominal pain, just hemorrhoidal pain.  Past Medical History  Diagnosis Date  . Hypertension   . Thyroid disease   . Prostate cancer   . Glaucoma(365)   . Borderline diabetes   . Osteopenia   . Prostate ca 04/23/2011  . High cholesterol     Meds: Prescriptions prior to admission  Medication Sig Dispense Refill  . amLODipine (NORVASC) 10 MG tablet Take 10 mg by mouth at bedtime.        Marland Kitchen aspirin EC 81 MG tablet Take 81 mg by mouth daily.      . bicalutamide (CASODEX) 50 MG tablet Take 1 tablet (50 mg total) by mouth daily.  30 tablet  3  . Calcium Carbonate-Vitamin D (CALCIUM 600+D) 600-400 MG-UNIT per tablet Take 1 tablet by mouth 2 (two) times daily.      Marland Kitchen levothyroxine (SYNTHROID, LEVOTHROID) 112 MCG tablet Take 112 mcg by mouth daily.        Marland Kitchen  lisinopril-hydrochlorothiazide (PRINZIDE,ZESTORETIC) 20-25 MG per tablet 2 tablets daily.       . magnesium gluconate (MAGONATE) 500 MG tablet Take 500 mg by mouth daily.        . metoprolol (LOPRESSOR) 100 MG tablet Take 50 mg by mouth 2 (two) times daily. Takes 1/2 tablet twice daily      . Multiple Vitamin (MULTIVITAMIN) tablet Take 1 tablet by mouth daily.        . niacin 250 MG tablet Take 250 mg by mouth daily with breakfast.       . Omega-3 Fatty Acids (FISH OIL) 1200 MG CAPS Take 1 capsule by mouth 2 (two) times daily.      . potassium chloride SA (K-DUR,KLOR-CON) 20 MEQ tablet Take 1 tablet (20 mEq total) by mouth 3 (three) times daily.  90 tablet  5  . pravastatin (PRAVACHOL) 20 MG tablet Take 20 mg by mouth Daily.      . timolol (BETIMOL) 0.5 % ophthalmic solution Place 1 drop into both eyes 2 (two) times daily.      Marland Kitchen alendronate (FOSAMAX) 35 MG tablet Take 1 tablet (35 mg total) by mouth every 7 (seven) days. Take with a full glass of water on an empty stomach.  4 tablet  2  . Triptorelin Pamoate (TRELSTAR MIXJECT) 22.5 MG injection Inject 22.5 mg into the muscle every 6 (six) months.  Allergies: Phenytoin sodium extended History   Social History  . Marital Status: Married    Spouse Name: N/A    Number of Children: N/A  . Years of Education: N/A   Occupational History  . Not on file.   Social History Main Topics  . Smoking status: Never Smoker   . Smokeless tobacco: Never Used  . Alcohol Use: Yes     Comment: occasional glass of wine or beer with a meal  . Drug Use: No  . Sexually Active:    Other Topics Concern  . Not on file   Social History Narrative  . No narrative on file   Family History  Problem Relation Age of Onset  . Hypertension Father   . Hypertension Brother   . Hypertension Paternal Aunt   . Hypertension Paternal Uncle    Past Surgical History  Procedure Laterality Date  . Tonsillectomy    . Prostate biopsy    . Brain meningioma  excision      Review of Systems: Systems reviewed and as per HPI, otherwise negative.  Physical Exam: Blood pressure 135/63, pulse 75, temperature 97.9 F (36.6 C), resp. rate 20, height 5\' 10"  (1.778 m), weight 91.717 kg (202 lb 3.2 oz), SpO2 96.00%.  BP 144/56  Pulse 69  Temp(Src) 97.9 F (36.6 C) (Oral)  Resp 20  Ht 5\' 10"  (1.778 m)  Wt 91.717 kg (202 lb 3.2 oz)  BMI 29.01 kg/m2  SpO2 98%  General Appearance:    Alert, cooperative, no distress, appears stated age  Head:    Normocephalic, without obvious abnormality, atraumatic  Eyes:    PERRL, conjunctiva/corneas clear, EOM's intact, fundi    benign, both eyes       Ears:    Normal TM's and external ear canals, both ears  Nose:   Nares normal, septum midline, mucosa normal, no drainage   or sinus tenderness  Throat:   Lips, mucosa, and tongue normal; teeth and gums normal  Neck:   Supple, symmetrical, trachea midline, no adenopathy;       thyroid:  No enlargement/tenderness/nodules; no carotid   bruit or JVD  Back:     Symmetric, no curvature, ROM normal, no CVA tenderness  Lungs:     Clear to auscultation bilaterally, respirations unlabored  Chest wall:    No tenderness or deformity  Heart:    Regular rate and rhythm, S1 and S2 normal, no murmur, rub   or gallop  Abdomen:     Soft, non-tender, bowel sounds active all four quadrants,    no masses, no organomegaly  Genitalia:      Rectal:    digital rectal exam not performed. External examination reveals large hemorrhoids without bleeding or thrombosis.  fecal soilage present.   Extremities:   Extremities normal, atraumatic, no cyanosis or edema  Pulses:   2+ and symmetric all extremities  Skin:   Skin color, texture, turgor normal, no rashes or lesions  Lymph nodes:   Cervical, supraclavicular, and axillary nodes normal  Neurologic:   CNII-XII intact. Normal strength, sensation and reflexes      throughout    Psychiatric: Normal affect. Calm and cooperative.  Lab  results: Basic Metabolic Panel:  Recent Labs  46/96/29 1429  NA 135  K 2.6*  CL 96  CO2 27  GLUCOSE 137*  BUN 22  CREATININE 1.20  CALCIUM 9.7  MG 2.4   Liver Function Tests: No results found for this basename: AST, ALT, ALKPHOS, BILITOT, PROT,  ALBUMIN,  in the last 72 hours No results found for this basename: LIPASE, AMYLASE,  in the last 72 hours No results found for this basename: AMMONIA,  in the last 72 hours CBC:  Recent Labs  03/02/12 1429  WBC 12.8*  HGB 13.9  HCT 39.4  MCV 94.0  PLT 201   Imaging results:  Dg Abd 2 Views  03/02/2012  *RADIOLOGY REPORT*  Clinical Data: Abdominal pain with constipation.  Question obstruction.  ABDOMEN - 2 VIEW  Comparison: Abdominal CT 01/22/2011.  Findings: Stool throughout the colon does not appear significantly increased.  There is no evidence of bowel obstruction or pneumoperitoneum.  There are no suspicious abdominal calcifications.  Mild degenerative changes of the lumbar spine and hips are stable.  IMPRESSION: No evidence of bowel obstruction or other acute process.  No significantly increased stool burden identified.   Original Report Authenticated By: [name], M.D.     Assessment & Plan: Principal Problem:   Fecal impaction has apparently now resolved. Will give an enema to ensure that loose stool is not bypassing an area of hard impacted stool. Continue MiraLAX. Active Problems:   Hypokalemia likely contributing to above. Will replete by mouth. Check magnesium.   Essential hypertension, benign   Prostate ca   Unspecified hypothyroidism: Check TSH.   External hemorrhoids   Other and unspecified hyperlipidemia   Glaucoma   SULLIVAN,CORINNA L 03/02/2012, 9:19 PM

## 2012-03-02 NOTE — Progress Notes (Signed)
CRITICAL VALUE ALERT  Critical value received:  Potassium 2.6  Date of notification:  03/02/12  Time of notification:  1620  Critical value read back: yes  Nurse who received alert:  Teodoro Kil  MD notified (1st page):  Lendell Caprice  Time of first page:  1625  MD notified (2nd page):  Time of second page:  Responding MD:     Time MD responded:  1627  New orders given and followed

## 2012-03-02 NOTE — Progress Notes (Signed)
Surgery  Pt known to me.  Admitted to med. Service with fecal impaction.  I saw him yesterday in office unable to digitally disimpact and pt.opted for treatment as out pt.with enemas and stool softeners without favorable result.  He has been admitted and he has now had a voluminously ferocious BM plugging the commode and with accompanying clinical improvement.  Have reviewed x rays and labs.  Needs K+ replacement, but otherwise is doing quite well.  His abdomen is soft and bowel sound are normoactive and he is passing flatus..  All of this is good news as I thought I might have to disimpact him in OR due to perianal discomfort from swollen peri anal mucosa from straining,  He does not have obvious thrombosed ext. hemorrhoids at this point and I think we can discharge him on stool softeners and warm compressses and anal analgesic cream and I can follow as out pt.  I thank Dr. Lendell Caprice for her collegial support.  Will follow up in AM prior to his discharge.  Filed Vitals:   03/02/12 1330  BP: 135/63  Pulse: 75  Temp: 97.9 F (36.6 C)  Resp: 20    Thanks

## 2012-03-02 NOTE — Progress Notes (Signed)
Notified Dr. Lendell Caprice of patients large stool, and success after enema.

## 2012-03-03 DIAGNOSIS — K644 Residual hemorrhoidal skin tags: Secondary | ICD-10-CM

## 2012-03-03 MED ORDER — DSS 100 MG PO CAPS: 100.0000 mg | ORAL_CAPSULE | Freq: Every day | ORAL | Status: AC

## 2012-03-03 MED ORDER — MAGNESIUM OXIDE 400 (241.3 MG) MG PO TABS
400.0000 mg | ORAL_TABLET | Freq: Every day | ORAL | Status: DC
  Administered 2012-03-03: 400 mg via ORAL
  Filled 2012-03-03: qty 1

## 2012-03-03 MED ORDER — POLYETHYLENE GLYCOL 3350 17 G PO PACK: 17.0000 g | PACK | ORAL | Status: AC | PRN

## 2012-03-03 NOTE — Progress Notes (Signed)
UR Chart Review Completed  

## 2012-03-03 NOTE — Discharge Summary (Signed)
Physician Discharge Summary  Patient ID: Shane Hall MRN: [id number] DOB/AGE: 1934-06-08 78 y.o.  Admit date: 03/02/2012 Discharge date: 03/03/2012  Discharge Diagnoses:  Principal Problem:   Fecal impaction Active Problems:   Hypokalemia   Essential hypertension, benign   Prostate ca   Unspecified hypothyroidism   External hemorrhoids   Other and unspecified hyperlipidemia   Glaucoma    Medication List    TAKE these medications       alendronate 35 MG tablet  Commonly known as:  FOSAMAX  Take 1 tablet (35 mg total) by mouth every 7 (seven) days. Take with a full glass of water on an empty stomach.     amLODipine 10 MG tablet  Commonly known as:  NORVASC  Take 10 mg by mouth at bedtime.     aspirin EC 81 MG tablet  Take 81 mg by mouth daily.     bicalutamide 50 MG tablet  Commonly known as:  CASODEX  Take 1 tablet (50 mg total) by mouth daily.     CALCIUM 600+D 600-400 MG-UNIT per tablet  Generic drug:  Calcium Carbonate-Vitamin D  Take 1 tablet by mouth 2 (two) times daily.     DSS 100 MG Caps  Take 100 mg by mouth daily.     Fish Oil 1200 MG Caps  Take 1 capsule by mouth 2 (two) times daily.     levothyroxine 112 MCG tablet  Commonly known as:  SYNTHROID, LEVOTHROID  Take 112 mcg by mouth daily.     lisinopril-hydrochlorothiazide 20-25 MG per tablet  Commonly known as:  PRINZIDE,ZESTORETIC  2 tablets daily.     magnesium gluconate 500 MG tablet  Commonly known as:  MAGONATE  Take 500 mg by mouth daily.     metoprolol 100 MG tablet  Commonly known as:  LOPRESSOR  Take 50 mg by mouth 2 (two) times daily. Takes 1/2 tablet twice daily     multivitamin tablet  Take 1 tablet by mouth daily.     niacin 250 MG tablet  Take 250 mg by mouth daily with breakfast.     polyethylene glycol packet  Commonly known as:  MIRALAX / GLYCOLAX  Take 17 g by mouth as needed.     potassium chloride SA 20 MEQ tablet  Commonly known as:  K-DUR,KLOR-CON  Take 1  tablet (20 mEq total) by mouth 3 (three) times daily.     pravastatin 20 MG tablet  Commonly known as:  PRAVACHOL  Take 20 mg by mouth Daily.     timolol 0.5 % ophthalmic solution  Commonly known as:  BETIMOL  Place 1 drop into both eyes 2 (two) times daily.     TRELSTAR MIXJECT 22.5 MG injection  Generic drug:  Triptorelin Pamoate  Inject 22.5 mg into the muscle every 6 (six) months.           Discharge Orders   Future Appointments Provider Department Dept Phone   03/12/2012 9:40 AM [facility] [phone]   03/21/2012 9:00 AM Randall An, MD [facility] [phone]   04/09/2012 9:30 AM [facility] [phone]   04/22/2012 10:30 AM [facility] [phone]   Future Orders Complete By Expires     Activity as tolerated - No restrictions  As directed     Diet - low sodium heart healthy  As directed     Scheduling Instructions:  High fiber       Follow-up Information   Follow up with [facility], MD In 2 weeks. (to check potassium)    Contact information:    [place] [id number]      Disposition:  home  Discharged Condition: stable  Consults:   Shane Hall  Labs:   Results for orders placed during the hospital encounter of 03/02/12 (from the past 48 hour(s))  CBC     Status: Abnormal   Collection Time    03/02/12  2:29 PM      Result Value Range   WBC 12.8 (*) 4.0 - 10.5 K/uL   RBC 4.19 (*) 4.22 - 5.81 MIL/uL   Hemoglobin 13.9  13.0 - 17.0 g/dL   HCT 86.5  78.4 - 69.6 %   MCV 94.0  78.0 - 100.0 fL   MCH 33.2  26.0 - 34.0 pg   MCHC 35.3  30.0 - 36.0 g/dL   RDW 29.5  28.4 - 13.2 %   Platelets 201  150 - 400 K/uL  BASIC METABOLIC PANEL     Status: Abnormal   Collection Time    03/02/12  2:29 PM      Result Value Range   Sodium 135  135 - 145 mEq/L   Potassium 2.6 (*) 3.5 - 5.1 mEq/L   Comment: CRITICAL RESULT CALLED TO, READ BACK BY AND VERIFIED WITH:      WATKINS T. AT 1619 ON [id number] BY THOMPSON S.   Chloride 96  96 - 112 mEq/L   CO2 27  19 - 32 mEq/L   Glucose, Bld 137 (*) 70 - 99 mg/dL   BUN 22  6 - 23 mg/dL   Creatinine, Ser 7.25  0.50 - 1.35 mg/dL   Calcium 9.7  8.4 - 36.6 mg/dL   GFR calc non Af Amer 57 (*) >90 mL/min   GFR calc Af Amer 66 (*) >90 mL/min   Comment:            The eGFR has been calculated     using the CKD EPI equation.     This calculation has not been     validated in all clinical     situations.     eGFR's persistently     <90 mL/min signify     possible Chronic Kidney Disease.  MAGNESIUM     Status: None   Collection Time    03/02/12  2:29 PM      Result Value Range   Magnesium 2.4  1.5 - 2.5 mg/dL  TSH     Status: None   Collection Time    03/02/12  2:29 PM      Result Value Range   TSH 2.100  0.350 - 4.500 uIU/mL    Diagnostics:  Dg Abd 2 Views  03/02/2012  *RADIOLOGY REPORT*  Clinical Data: Abdominal pain with constipation.  Question obstruction.  ABDOMEN - 2 VIEW  Comparison: Abdominal CT 01/22/2011.  Findings: Stool throughout the colon does not appear significantly increased.  There is no evidence of bowel obstruction or pneumoperitoneum.  There are no suspicious abdominal calcifications.  Mild degenerative changes of the lumbar spine and hips are stable.  IMPRESSION: No evidence of bowel obstruction or other acute process.  No significantly increased stool burden identified.   Original Report Authenticated By: [name], M.D.    Full Code   Hospital Course: See H&P for complete admission details. The patient is a 78 year old white male who is seeing Shane Hall  in the office for fecal impaction. In the office, a manual disimpaction was attempted but unsuccessful. The patient went home and took laxatives. He attempted an enema but apparently had difficulty administering it to himself.  He was directly admitted to the hospitalist service per Dr. [name] request. Lab work revealed hypokalemia. 2  views of the abdomen showed no obstruction. Patient received an enema and laxatives. He had good results. His potassium was repleted. He was placed on observation and discharged the following day.  Discharge Exam:  Blood pressure 144/56, pulse 69, temperature 97.9 F (36.6 C), temperature source Oral, resp. rate 20, height 5\' 10"  (1.778 m), weight 91.717 kg (202 lb 3.2 oz), SpO2 98.00%.  General: Comfortable. Dressed in street) walking around his room. Abdomen soft nontender nondistended  Signed: SULLIVAN,CORINNA L 03/03/2012, 9:36 AM

## 2012-03-03 NOTE — Progress Notes (Signed)
Pt discharged with instructions, prescriptions, and carenotes.  He verbalized understanding.   Pt left the floor via w/c with staff in stable condition.    I called him at home and discussed with him the importance of using warm compresses at home on his hemorrhoids.   He verbalized understanding.

## 2012-03-12 ENCOUNTER — Encounter (HOSPITAL_COMMUNITY): Payer: Medicare Other | Attending: Oncology

## 2012-03-12 DIAGNOSIS — C61 Malignant neoplasm of prostate: Secondary | ICD-10-CM

## 2012-03-12 DIAGNOSIS — E876 Hypokalemia: Secondary | ICD-10-CM | POA: Insufficient documentation

## 2012-03-12 LAB — CBC
HCT: 39.4 % (ref 39.0–52.0)
MCH: 33 pg (ref 26.0–34.0)
MCV: 95.6 fL (ref 78.0–100.0)
RBC: 4.12 MIL/uL — ABNORMAL LOW (ref 4.22–5.81)
WBC: 8 10*3/uL (ref 4.0–10.5)

## 2012-03-12 LAB — COMPREHENSIVE METABOLIC PANEL
BUN: 20 mg/dL (ref 6–23)
CO2: 30 mEq/L (ref 19–32)
Calcium: 9.8 mg/dL (ref 8.4–10.5)
Creatinine, Ser: 1.26 mg/dL (ref 0.50–1.35)
GFR calc Af Amer: 62 mL/min — ABNORMAL LOW (ref >90)
GFR calc non Af Amer: 54 mL/min — ABNORMAL LOW (ref >90)
Glucose, Bld: 98 mg/dL (ref 70–99)
Total Protein: 7.1 g/dL (ref 6.0–8.3)

## 2012-03-12 LAB — DIFFERENTIAL
Eosinophils Absolute: 0.3 10*3/uL (ref 0.0–0.7)
Eosinophils Relative: 3 % (ref 0–5)
Lymphocytes Relative: 35 % (ref 12–46)
Lymphs Abs: 2.8 10*3/uL (ref 0.7–4.0)
Monocytes Absolute: 0.5 10*3/uL (ref 0.1–1.0)

## 2012-03-12 LAB — PSA: PSA: 2.29 ng/mL (ref <=4.00)

## 2012-03-12 NOTE — Progress Notes (Signed)
Labs drawn today for cbc/diff,cmp,psa 

## 2012-03-21 ENCOUNTER — Encounter (HOSPITAL_BASED_OUTPATIENT_CLINIC_OR_DEPARTMENT_OTHER): Payer: Medicare Other | Admitting: Oncology

## 2012-03-21 ENCOUNTER — Encounter (HOSPITAL_COMMUNITY): Payer: Self-pay | Admitting: Oncology

## 2012-03-21 VITALS — BP 139/59 | HR 55 | Temp 98.7°F | Resp 18 | Wt 206.7 lb

## 2012-03-21 DIAGNOSIS — C61 Malignant neoplasm of prostate: Secondary | ICD-10-CM

## 2012-03-21 DIAGNOSIS — G609 Hereditary and idiopathic neuropathy, unspecified: Secondary | ICD-10-CM

## 2012-03-21 DIAGNOSIS — E876 Hypokalemia: Secondary | ICD-10-CM

## 2012-03-21 DIAGNOSIS — K5900 Constipation, unspecified: Secondary | ICD-10-CM

## 2012-03-21 NOTE — Progress Notes (Signed)
Diagnosis number 1 recurrent prostate cancer thus far with a biochemical recurrence only. He is still on Trelstar and bicalutamide. His PSA had risen but has stabilized. He remains asymptomatic on oncology review of systems  #2 constipation for which she was admitted to the hospital overnight for severe impaction and tremendous pain. We discussed in detail today the fact that he needs 5-9 fiber rich foods and re\re single day. Went over all the foods that make up those fiber rich ingredients that he needs. He is basically getting 2 or 3 per day presently at best. We also talked about increasing water content but with his diuretic it's difficult to maintain hydration completely. We talked about exercise but because of his plantar pain that has been difficult. So we will at least focus on his diet.  #3 peripheral neuropathy, mildly symptomatic, having tried low dose gabapentin in the past without success. He did not want to try that again nor amitriptyline just yet.  Vital signs are stable he looks fabulous. Almost fully functional except for the exercise part of the equation.  After extensive conversation about his constipation we will continue to monitor his PSAs and get a bone scan in June and I will see him right after that. I do not think we need to switch therapy at this juncture.

## 2012-03-21 NOTE — Patient Instructions (Addendum)
[facility] Discharge Instructions  RECOMMENDATIONS MADE BY THE CONSULTANT AND ANY TEST RESULTS WILL BE SENT TO YOUR REFERRING PHYSICIAN.  EXAM FINDINGS BY THE PHYSICIAN TODAY AND SIGNS OR SYMPTOMS TO REPORT TO CLINIC OR PRIMARY PHYSICIAN: Exam and discussion by MD.  We will check a BMET with your next PSA.  MEDICATIONS PRESCRIBED:  none  INSTRUCTIONS GIVEN AND DISCUSSED: Report increased bone pain, shortness of breath or other problems.  SPECIAL INSTRUCTIONS/FOLLOW-UP: Bone Scan in June and to see MD afterwards.  Thank you for choosing [facility] to provide your oncology and hematology care.  To afford each patient quality time with our providers, please arrive at least 15 minutes before your scheduled appointment time.  With your help, our goal is to use those 15 minutes to complete the necessary work-up to ensure our physicians have the information they need to help with your evaluation and healthcare recommendations.    Effective January 1st, 2014, we ask that you re-schedule your appointment with our physicians should you arrive 10 or more minutes late for your appointment.  We strive to give you quality time with our providers, and arriving late affects you and other patients whose appointments are after yours.    Again, thank you for choosing [facility].  Our hope is that these requests will decrease the amount of time that you wait before being seen by our physicians.       _____________________________________________________________  Should you have questions after your visit to [facility], please contact our office at [phone] between the hours of 8:30 a.m. and 5:00 p.m.  Voicemails left after 4:30 p.m. will not be returned until the following business day.  For prescription refill requests, have your pharmacy contact our office with your prescription refill request.

## 2012-03-27 ENCOUNTER — Other Ambulatory Visit (HOSPITAL_COMMUNITY): Payer: Self-pay | Admitting: Oncology

## 2012-03-27 DIAGNOSIS — C61 Malignant neoplasm of prostate: Secondary | ICD-10-CM

## 2012-03-27 DIAGNOSIS — E876 Hypokalemia: Secondary | ICD-10-CM

## 2012-03-27 MED ORDER — POTASSIUM CHLORIDE CRYS ER 20 MEQ PO TBCR: 20.0000 meq | EXTENDED_RELEASE_TABLET | Freq: Four times a day (QID) | ORAL | Status: DC

## 2012-04-09 ENCOUNTER — Encounter (HOSPITAL_COMMUNITY): Payer: Medicare Other | Attending: Oncology

## 2012-04-09 DIAGNOSIS — E876 Hypokalemia: Secondary | ICD-10-CM

## 2012-04-09 DIAGNOSIS — C61 Malignant neoplasm of prostate: Secondary | ICD-10-CM | POA: Insufficient documentation

## 2012-04-09 LAB — PSA: PSA: 2.04 ng/mL (ref <=4.00)

## 2012-04-09 LAB — BASIC METABOLIC PANEL
Calcium: 9.6 mg/dL (ref 8.4–10.5)
GFR calc Af Amer: 60 mL/min — ABNORMAL LOW (ref >90)
GFR calc non Af Amer: 52 mL/min — ABNORMAL LOW (ref >90)
Glucose, Bld: 153 mg/dL — ABNORMAL HIGH (ref 70–99)
Sodium: 142 mEq/L (ref 135–145)

## 2012-04-09 NOTE — Progress Notes (Signed)
Labs drawn today for bmp,psa

## 2012-04-21 ENCOUNTER — Other Ambulatory Visit (HOSPITAL_COMMUNITY): Payer: Self-pay | Admitting: Oncology

## 2012-04-21 DIAGNOSIS — C61 Malignant neoplasm of prostate: Secondary | ICD-10-CM

## 2012-04-21 MED ORDER — ALENDRONATE SODIUM 35 MG PO TABS: 35.0000 mg | ORAL_TABLET | ORAL | Status: DC

## 2012-04-22 ENCOUNTER — Encounter (HOSPITAL_BASED_OUTPATIENT_CLINIC_OR_DEPARTMENT_OTHER): Payer: Medicare Other

## 2012-04-22 DIAGNOSIS — C61 Malignant neoplasm of prostate: Secondary | ICD-10-CM

## 2012-04-22 MED ORDER — TRIPTORELIN PAMOATE 22.5 MG IM SUSR
22.5000 mg | Freq: Once | INTRAMUSCULAR | Status: AC
  Administered 2012-04-22: 22.5 mg via INTRAMUSCULAR
  Filled 2012-04-22: qty 22.5

## 2012-04-22 NOTE — Progress Notes (Signed)
Shane Hall presents today for injection per MD orders. Trelstar 22.5 mg administered IM in right Gluteal. Administration without incident. Patient tolerated well.

## 2012-05-08 ENCOUNTER — Encounter (HOSPITAL_COMMUNITY): Payer: Medicare Other | Attending: Oncology

## 2012-05-08 ENCOUNTER — Other Ambulatory Visit (HOSPITAL_COMMUNITY): Payer: Self-pay | Admitting: Oncology

## 2012-05-08 DIAGNOSIS — C61 Malignant neoplasm of prostate: Secondary | ICD-10-CM | POA: Insufficient documentation

## 2012-05-08 DIAGNOSIS — E876 Hypokalemia: Secondary | ICD-10-CM | POA: Insufficient documentation

## 2012-05-08 LAB — BASIC METABOLIC PANEL
BUN: 17 mg/dL (ref 6–23)
GFR calc Af Amer: 72 mL/min — ABNORMAL LOW (ref >90)
GFR calc non Af Amer: 62 mL/min — ABNORMAL LOW (ref >90)
Potassium: 3.2 mEq/L — ABNORMAL LOW (ref 3.5–5.1)

## 2012-05-08 LAB — PSA: PSA: 2.12 ng/mL (ref <=4.00)

## 2012-05-08 NOTE — Progress Notes (Signed)
Labs drawn today for bmp,psa 

## 2012-05-23 ENCOUNTER — Other Ambulatory Visit (HOSPITAL_COMMUNITY): Payer: Self-pay | Admitting: Oncology

## 2012-05-23 ENCOUNTER — Telehealth (HOSPITAL_COMMUNITY): Payer: Self-pay | Admitting: Oncology

## 2012-05-23 DIAGNOSIS — C61 Malignant neoplasm of prostate: Secondary | ICD-10-CM

## 2012-05-23 DIAGNOSIS — E876 Hypokalemia: Secondary | ICD-10-CM

## 2012-05-23 MED ORDER — POTASSIUM CHLORIDE CRYS ER 20 MEQ PO TBCR: 20.0000 meq | EXTENDED_RELEASE_TABLET | Freq: Every day | ORAL | Status: DC

## 2012-06-23 ENCOUNTER — Encounter (HOSPITAL_COMMUNITY): Payer: Self-pay

## 2012-06-23 ENCOUNTER — Encounter (HOSPITAL_COMMUNITY)
Admission: RE | Admit: 2012-06-23 | Discharge: 2012-06-23 | Disposition: A | Discharge status: Home / Self Care | Payer: Medicare Other | Source: Ambulatory Visit | Attending: Oncology | Admitting: Oncology

## 2012-06-23 DIAGNOSIS — C61 Malignant neoplasm of prostate: Secondary | ICD-10-CM

## 2012-06-23 DIAGNOSIS — E876 Hypokalemia: Secondary | ICD-10-CM | POA: Insufficient documentation

## 2012-06-23 MED ORDER — TECHNETIUM TC 99M MEDRONATE IV KIT
25.0000 | PACK | Freq: Once | INTRAVENOUS | Status: AC | PRN
  Administered 2012-06-23: 25 via INTRAVENOUS

## 2012-06-27 ENCOUNTER — Encounter (HOSPITAL_COMMUNITY): Payer: Self-pay | Admitting: Oncology

## 2012-06-27 ENCOUNTER — Encounter (HOSPITAL_COMMUNITY): Payer: Medicare Other | Attending: Oncology | Admitting: Oncology

## 2012-06-27 VITALS — BP 145/75 | HR 58 | Temp 97.6°F | Resp 16 | Wt 203.0 lb

## 2012-06-27 DIAGNOSIS — C61 Malignant neoplasm of prostate: Secondary | ICD-10-CM | POA: Insufficient documentation

## 2012-06-27 LAB — COMPREHENSIVE METABOLIC PANEL
ALT: 15 U/L (ref 0–53)
Albumin: 4.2 g/dL (ref 3.5–5.2)
Alkaline Phosphatase: 55 U/L (ref 39–117)
Potassium: 3.7 mEq/L (ref 3.5–5.1)
Sodium: 143 mEq/L (ref 135–145)
Total Protein: 7.9 g/dL (ref 6.0–8.3)

## 2012-06-27 LAB — CBC WITH DIFFERENTIAL/PLATELET
Basophils Relative: 0 % (ref 0–1)
Eosinophils Absolute: 0.3 10*3/uL (ref 0.0–0.7)
Lymphs Abs: 2.2 10*3/uL (ref 0.7–4.0)
MCH: 33 pg (ref 26.0–34.0)
MCHC: 34.4 g/dL (ref 30.0–36.0)
Neutro Abs: 6 10*3/uL (ref 1.7–7.7)
Neutrophils Relative %: 66 % (ref 43–77)
Platelets: 206 10*3/uL (ref 150–400)
RBC: 4.3 MIL/uL (ref 4.22–5.81)

## 2012-06-27 NOTE — Progress Notes (Signed)
#  1 biochemical positive recurrent prostate cancer still hormone sensitive with recent negative bone scan Will continue present course of RX  #2 Hypokalemia which is now normal but he will discuss the BP medication with his PCP  #3 chronic constipation  #4 Peripheral neuropathy which may or may not be due to his cancer. He tried low dose gabapentin in the past with no real improvement and I have asked him to discuss this again with his PCP about more robust doses since there may be a threshold effect.  Otherwise he is doing the same on ROS VS stable Labs are pending from today but would not change anything yet.  He will return as scheduled.

## 2012-06-27 NOTE — Patient Instructions (Addendum)
[facility] Discharge Instructions  RECOMMENDATIONS MADE BY THE CONSULTANT AND ANY TEST RESULTS WILL BE SENT TO YOUR REFERRING PHYSICIAN.  EXAM FINDINGS BY THE PHYSICIAN TODAY AND SIGNS OR SYMPTOMS TO REPORT TO CLINIC OR PRIMARY PHYSICIAN: discussion by MD.  Talk with Dr. Margo Aye about the Lisinopril with Hydrochlorothiazide.  That is probably what is causing you to lose your potassium.  MEDICATIONS PRESCRIBED:  none  INSTRUCTIONS GIVEN AND DISCUSSED: Report bone pain, shortness of breath or other problems.  SPECIAL INSTRUCTIONS/FOLLOW-UP: Injections as scheduled and follow-up in 3 months  Thank you for choosing [facility] to provide your oncology and hematology care.  To afford each patient quality time with our providers, please arrive at least 15 minutes before your scheduled appointment time.  With your help, our goal is to use those 15 minutes to complete the necessary work-up to ensure our physicians have the information they need to help with your evaluation and healthcare recommendations.    Effective January 1st, 2014, we ask that you re-schedule your appointment with our physicians should you arrive 10 or more minutes late for your appointment.  We strive to give you quality time with our providers, and arriving late affects you and other patients whose appointments are after yours.    Again, thank you for choosing [facility].  Our hope is that these requests will decrease the amount of time that you wait before being seen by our physicians.       _____________________________________________________________  Should you have questions after your visit to [facility], please contact our office at [phone] between the hours of 8:30 a.m. and 5:00 p.m.  Voicemails left after 4:30 p.m. will not be returned until the following business day.  For prescription refill requests, have your pharmacy contact our office with your  prescription refill request.

## 2012-07-25 ENCOUNTER — Encounter (HOSPITAL_COMMUNITY): Payer: Medicare Other | Attending: Oncology

## 2012-07-25 ENCOUNTER — Other Ambulatory Visit (HOSPITAL_COMMUNITY): Payer: Self-pay | Admitting: Oncology

## 2012-07-25 DIAGNOSIS — C61 Malignant neoplasm of prostate: Secondary | ICD-10-CM

## 2012-07-25 DIAGNOSIS — E876 Hypokalemia: Secondary | ICD-10-CM | POA: Insufficient documentation

## 2012-07-25 LAB — BASIC METABOLIC PANEL
CO2: 32 mEq/L (ref 19–32)
Chloride: 102 mEq/L (ref 96–112)
Creatinine, Ser: 1.08 mg/dL (ref 0.50–1.35)
GFR calc Af Amer: 75 mL/min — ABNORMAL LOW (ref >90)
Potassium: 3.8 mEq/L (ref 3.5–5.1)
Sodium: 142 mEq/L (ref 135–145)

## 2012-07-25 MED ORDER — ALENDRONATE SODIUM 35 MG PO TABS: 35.0000 mg | ORAL_TABLET | ORAL | Status: AC

## 2012-07-25 NOTE — Progress Notes (Signed)
Labs drawn today for psa 

## 2012-07-25 NOTE — Addendum Note (Signed)
Addended by: Evelena Leyden on: 07/25/2012 09:50 AM   Modules accepted: Orders

## 2012-08-22 ENCOUNTER — Encounter (HOSPITAL_COMMUNITY): Payer: Medicare Other | Attending: Oncology

## 2012-08-22 DIAGNOSIS — C61 Malignant neoplasm of prostate: Secondary | ICD-10-CM | POA: Insufficient documentation

## 2012-08-22 LAB — PSA: PSA: 2.41 ng/mL (ref <=4.00)

## 2012-08-22 NOTE — Progress Notes (Signed)
Labs drawn today for psa 

## 2012-08-25 ENCOUNTER — Other Ambulatory Visit (HOSPITAL_COMMUNITY): Payer: Self-pay | Admitting: Oncology

## 2012-08-25 DIAGNOSIS — C61 Malignant neoplasm of prostate: Secondary | ICD-10-CM

## 2012-08-25 MED ORDER — POTASSIUM CHLORIDE CRYS ER 20 MEQ PO TBCR: 20.0000 meq | EXTENDED_RELEASE_TABLET | Freq: Every day | ORAL | Status: DC

## 2012-09-01 HISTORY — PX: CATARACT EXTRACTION W/ INTRAOCULAR LENS IMPLANT: SHX1309

## 2012-09-19 ENCOUNTER — Encounter (HOSPITAL_COMMUNITY): Payer: Medicare Other | Attending: Oncology

## 2012-09-19 DIAGNOSIS — C61 Malignant neoplasm of prostate: Secondary | ICD-10-CM | POA: Insufficient documentation

## 2012-09-25 ENCOUNTER — Encounter (HOSPITAL_COMMUNITY): Payer: Self-pay

## 2012-09-25 ENCOUNTER — Encounter (HOSPITAL_BASED_OUTPATIENT_CLINIC_OR_DEPARTMENT_OTHER): Payer: Medicare Other

## 2012-09-25 VITALS — BP 161/79 | HR 65 | Temp 98.3°F | Resp 16 | Wt 204.6 lb

## 2012-09-25 DIAGNOSIS — C61 Malignant neoplasm of prostate: Secondary | ICD-10-CM

## 2012-09-25 LAB — COMPREHENSIVE METABOLIC PANEL
Albumin: 3.9 g/dL (ref 3.5–5.2)
Alkaline Phosphatase: 52 U/L (ref 39–117)
BUN: 19 mg/dL (ref 6–23)
CO2: 32 mEq/L (ref 19–32)
Chloride: 100 mEq/L (ref 96–112)
Creatinine, Ser: 1.19 mg/dL (ref 0.50–1.35)
GFR calc non Af Amer: 58 mL/min — ABNORMAL LOW (ref >90)
Potassium: 3.5 mEq/L (ref 3.5–5.1)
Total Bilirubin: 0.5 mg/dL (ref 0.3–1.2)

## 2012-09-25 LAB — CBC WITH DIFFERENTIAL/PLATELET
Basophils Relative: 1 % (ref 0–1)
HCT: 39.5 % (ref 39.0–52.0)
Hemoglobin: 13.6 g/dL (ref 13.0–17.0)
Lymphocytes Relative: 24 % (ref 12–46)
Lymphs Abs: 1.5 10*3/uL (ref 0.7–4.0)
Monocytes Absolute: 0.5 10*3/uL (ref 0.1–1.0)
Monocytes Relative: 8 % (ref 3–12)
Neutro Abs: 4.1 10*3/uL (ref 1.7–7.7)
Neutrophils Relative %: 64 % (ref 43–77)
RBC: 4.07 MIL/uL — ABNORMAL LOW (ref 4.22–5.81)
WBC: 6.4 10*3/uL (ref 4.0–10.5)

## 2012-09-25 MED ORDER — TRIPTORELIN PAMOATE 22.5 MG IM SUSR: 22.5000 mg | Freq: Once | INTRAMUSCULAR | Status: DC

## 2012-09-25 NOTE — Progress Notes (Signed)
Shane Hall presented for labwork. Labs per MD order drawn via Peripheral Line 23 gauge needle inserted in left AC  Good blood return present. Procedure without incident.  Needle removed intact. Patient tolerated procedure well.

## 2012-09-25 NOTE — Patient Instructions (Addendum)
[facility] Discharge Instructions  RECOMMENDATIONS MADE BY THE CONSULTANT AND ANY TEST RESULTS WILL BE SENT TO YOUR REFERRING PHYSICIAN.  EXAM FINDINGS BY THE PHYSICIAN TODAY AND SIGNS OR SYMPTOMS TO REPORT TO CLINIC OR PRIMARY PHYSICIAN: Exam and findings as discussed by Dr. Zigmund Daniel.  Will check your blood work today and will let you know if we need to do anything different.  Talk with Dr. Margo Aye about the gabapentin.  MEDICATIONS PRESCRIBED:  none  INSTRUCTIONS/FOLLOW-UP: Trelstar injection in October and follow-up in 3 months.  Will call you with appointments.  Thank you for choosing [facility] to provide your oncology and hematology care.  To afford each patient quality time with our providers, please arrive at least 15 minutes before your scheduled appointment time.  With your help, our goal is to use those 15 minutes to complete the necessary work-up to ensure our physicians have the information they need to help with your evaluation and healthcare recommendations.    Effective January 1st, 2014, we ask that you re-schedule your appointment with our physicians should you arrive 10 or more minutes late for your appointment.  We strive to give you quality time with our providers, and arriving late affects you and other patients whose appointments are after yours.    Again, thank you for choosing [facility].  Our hope is that these requests will decrease the amount of time that you wait before being seen by our physicians.       _____________________________________________________________  Should you have questions after your visit to [facility], please contact our office at [phone] between the hours of 8:30 a.m. and 5:00 p.m.  Voicemails left after 4:30 p.m. will not be returned until the following business day.  For prescription refill requests, have your pharmacy contact our office with your prescription refill request.

## 2012-09-25 NOTE — Progress Notes (Signed)
[facility] OFFICE PROGRESS NOTE  Catalina Pizza, MD [place] [address]  DIAGNOSIS: Prostate ca - Plan: CBC with Differential, Comprehensive metabolic panel, PSA, CBC with Differential, Comprehensive metabolic panel, PSA, DG Bone Density, Differential, Basic metabolic panel, Magnesium, PSA  Chief Complaint  Patient presents with  . Prostate Cancer    CURRENT THERAPY: Trelstar every 6 months, bicalutamide 50 mg daily  INTERVAL HISTORY: Shane Hall 78 y.o. male returns for followup of PSA only recurrence of prostate cancer. He is due for Trelstar in one month and continues to take bicalutamide 50 mg daily. He does experience hot flashes and does have breast enlargement with tenderness but no treatment is required. His primary complaint today is lower extremity burning sensation for which he was tried on low-dose gabapentin by his previous family physician without much success. No attempts were made at titration upward. Appetite is good with no nausea, vomiting, diarrhea, constipation, dysuria, hematuria, incontinence, skin rash, headache, or seizures. 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   MEDICAL HISTORY: Past Medical History  Diagnosis Date  . Hypertension   . Thyroid disease   . Prostate cancer   . Glaucoma   . Borderline diabetes   . Osteopenia   . Prostate ca 04/23/2011  . High cholesterol     INTERIM HISTORY: has Influenza vaccine needed; Prostate ca; Fecal impaction; Hypokalemia; Essential hypertension, benign; Unspecified hypothyroidism; External hemorrhoids; Other and unspecified hyperlipidemia; and Glaucoma on his problem list.    ALLERGIES:  is allergic to phenytoin sodium extended.  MEDICATIONS: has a current medication list which includes the following prescription(s): alendronate, amlodipine, aspirin ec, bicalutamide, calcium carbonate-vitamin d, dss, dorzolamide-timolol, levothyroxine, lisinopril-hydrochlorothiazide, magnesium gluconate,  metoprolol, multivitamin, niacin, fish oil, polyethylene glycol, potassium chloride sa, pravastatin, and triptorelin pamoate, and the following Facility-Administered Medications: triptorelin pamoate.  SURGICAL HISTORY:  Past Surgical History  Procedure Laterality Date  . Tonsillectomy    . Prostate biopsy    . Brain meningioma excision    . Cataract extraction w/ intraocular lens implant Left 09/2012    FAMILY HISTORY: family history includes Hypertension in his brother, father, paternal aunt, and paternal uncle.  SOCIAL HISTORY:  reports that he has never smoked. He has never used smokeless tobacco. He reports that  drinks alcohol. He reports that he does not use illicit drugs.  REVIEW OF SYSTEMS:  Other than that discussed above is noncontributory.  PHYSICAL EXAMINATION: ECOG PERFORMANCE STATUS: 1 - Symptomatic but completely ambulatory  Blood pressure 161/79, pulse 65, temperature 98.3 F (36.8 C), resp. rate 16, weight 204 lb 9.6 oz (92.806 kg).  GENERAL:alert, no distress and comfortable SKIN: skin color, texture, turgor are normal, no rashes or significant lesions. Skin is smooth EYES: normal, Conjunctiva are pink and non-injected, sclera clear OROPHARYNX:no exudate, no erythema and lips, buccal mucosa, and tongue normal  NECK: supple, thyroid normal size, non-tender, without nodularity CHEST: Normal AP diameter with bilateral gynecomastia. LYMPH:  no palpable lymphadenopathy in the cervical, axillary or inguinal LUNGS: clear to auscultation and percussion with normal breathing effort HEART: regular rate & rhythm and no murmurs and no lower extremity edema ABDOMEN:abdomen soft, non-tender and normal bowel sounds Musculoskeletal:no cyanosis of digits and no clubbing  NEURO: alert & oriented x 3 with fluent speech, no focal motor/sensory deficits   LABORATORY DATA: No visits with results within 30 Day(s) from this visit. Latest known visit with results is:  Infusion on  08/22/2012  Component Date Value Range Status  . PSA 08/22/2012  2.41  <=4.00 ng/mL Final   Comment: (NOTE)                          Test Methodology: ECLIA PSA (Electrochemiluminescence Immunoassay)                          For PSA values from 2.5-4.0, particularly in younger men <60 years                          old, the AUA and NCCN suggest testing for % Free PSA (3515) and                          evaluation of the rate of increase in PSA (PSA velocity).                          Performed at [facility]     Urinalysis No results found for this basename: colorurine,  appearanceur,  labspec,  phurine,  glucoseu,  hgbur,  bilirubinur,  ketonesur,  proteinur,  urobilinogen,  nitrite,  leukocytesur    RADIOGRAPHIC STUDIES:     *RADIOLOGY REPORT*  Clinical Data: Prostate cancer.  NUCLEAR MEDICINE WHOLE BODY BONE SCINTIGRAPHY  Technique: Whole body anterior and posterior images were obtained  approximately 3 hours after intravenous injection of  radiopharmaceutical.  Radiopharmaceutical: CURIE TC-MDP TECHNETIUM TC 38M  MEDRONATE IV KIT  Comparison: 01/22/2011.  Findings: Mild symmetric degenerative type uptake in the shoulders,  spine, knees and ankles. Uptake in the facial region is likely due  to sinus disease. No findings to suggest osseous metastatic  disease.  IMPRESSION:  Negative and stable whole-body bone scan.  Original Report Authenticated By: Rudie Meyer, M.D.     ASSESSMENT: 1 PSA only recurrence of prostate cancer, stable PSA in the range of 2.4. #2. Peripheral neuropathy possibly due to hypokalemia or hypomagnesemia. #3. At risk for osteoporosis, currently taking alendronate weekly with plans to repeat DEXA scan in January 2015.   PLAN: #1. Patient will return in 4 weeks to receive Trelstar 22.5 mg intramuscularly. #2. He was told to continue bicalutamide 50 mg daily. #3. He was advised to contact his family physician and to work together with  them in correcting his electrolyte abnormalities and possibly introducing gabapentin again with titration upward until relief is obtained or side effects become prohibitive. #4. Plan to followup in physical examination, lab test, and DEXA scan in 4 months. The patient is in agreement with this strategy.   All questions were answered. The patient knows to call the clinic with any problems, questions or concerns. We can certainly see the patient much sooner if necessary.   I spent 30 minutes counseling the patient face to face. The total time spent in the appointment was 25 minutes.    Maurilio Lovely, MD 09/25/2012 12:15 PM

## 2012-09-26 LAB — PSA: PSA: 2.75 ng/mL (ref <=4.00)

## 2012-10-13 ENCOUNTER — Other Ambulatory Visit (HOSPITAL_COMMUNITY): Payer: Self-pay | Admitting: Oncology

## 2012-10-13 DIAGNOSIS — C61 Malignant neoplasm of prostate: Secondary | ICD-10-CM

## 2012-10-13 MED ORDER — BICALUTAMIDE 50 MG PO TABS: 50.0000 mg | ORAL_TABLET | Freq: Every day | ORAL | Status: DC

## 2012-10-22 ENCOUNTER — Encounter (HOSPITAL_COMMUNITY): Payer: Medicare Other | Attending: Oncology

## 2012-10-22 VITALS — BP 175/82 | HR 56 | Temp 97.4°F | Resp 18

## 2012-10-22 DIAGNOSIS — C61 Malignant neoplasm of prostate: Secondary | ICD-10-CM

## 2012-10-22 MED ORDER — TRIPTORELIN PAMOATE 22.5 MG IM SUSR
22.5000 mg | Freq: Once | INTRAMUSCULAR | Status: AC
  Administered 2012-10-22: 22.5 mg via INTRAMUSCULAR
  Filled 2012-10-22: qty 22.5

## 2012-10-22 NOTE — Addendum Note (Signed)
Addended by: Edythe Lynn A on: 10/22/2012 02:28 PM   Modules accepted: Orders

## 2012-10-22 NOTE — Progress Notes (Signed)
Shane Hall presents today for injection per MD orders. trelstar 22.5 mg administered IM in right Gluteal. Administration without incident. Patient tolerated well.

## 2012-11-19 ENCOUNTER — Encounter (HOSPITAL_COMMUNITY): Payer: Medicare Other | Attending: Oncology

## 2012-11-19 DIAGNOSIS — C61 Malignant neoplasm of prostate: Secondary | ICD-10-CM

## 2012-11-19 LAB — PSA: PSA: 4.67 ng/mL — ABNORMAL HIGH (ref <=4.00)

## 2012-11-19 NOTE — Progress Notes (Signed)
Labs drawn today for psa 

## 2012-12-01 ENCOUNTER — Other Ambulatory Visit (HOSPITAL_COMMUNITY): Payer: Self-pay | Admitting: Oncology

## 2012-12-01 DIAGNOSIS — C61 Malignant neoplasm of prostate: Secondary | ICD-10-CM

## 2012-12-01 MED ORDER — POTASSIUM CHLORIDE CRYS ER 20 MEQ PO TBCR: 20.0000 meq | EXTENDED_RELEASE_TABLET | Freq: Every day | ORAL | Status: DC

## 2013-01-28 ENCOUNTER — Encounter (HOSPITAL_COMMUNITY): Payer: Medicare Other | Attending: Oncology

## 2013-01-28 DIAGNOSIS — C61 Malignant neoplasm of prostate: Secondary | ICD-10-CM

## 2013-01-28 LAB — CBC
HCT: 40.3 % (ref 39.0–52.0)
Hemoglobin: 13.7 g/dL (ref 13.0–17.0)
MCH: 33.1 pg (ref 26.0–34.0)
MCHC: 34 g/dL (ref 30.0–36.0)
MCV: 97.3 fL (ref 78.0–100.0)
Platelets: 192 10*3/uL (ref 150–400)
RBC: 4.14 MIL/uL — ABNORMAL LOW (ref 4.22–5.81)
RDW: 13.3 % (ref 11.5–15.5)
WBC: 7.7 10*3/uL (ref 4.0–10.5)

## 2013-01-28 LAB — BASIC METABOLIC PANEL
BUN: 19 mg/dL (ref 6–23)
CHLORIDE: 100 meq/L (ref 96–112)
CO2: 32 mEq/L (ref 19–32)
CREATININE: 1.22 mg/dL (ref 0.50–1.35)
Calcium: 9.7 mg/dL (ref 8.4–10.5)
GFR calc Af Amer: 65 mL/min — ABNORMAL LOW (ref >90)
GFR calc non Af Amer: 56 mL/min — ABNORMAL LOW (ref >90)
GLUCOSE: 104 mg/dL — AB (ref 70–99)
POTASSIUM: 3.7 meq/L (ref 3.7–5.3)
Sodium: 142 mEq/L (ref 137–147)

## 2013-01-28 LAB — MAGNESIUM: Magnesium: 2 mg/dL (ref 1.5–2.5)

## 2013-01-28 LAB — DIFFERENTIAL
BASOS PCT: 0 % (ref 0–1)
Basophils Absolute: 0 10*3/uL (ref 0.0–0.1)
EOS ABS: 0.3 10*3/uL (ref 0.0–0.7)
Eosinophils Relative: 4 % (ref 0–5)
LYMPHS ABS: 2.1 10*3/uL (ref 0.7–4.0)
Lymphocytes Relative: 28 % (ref 12–46)
MONO ABS: 0.5 10*3/uL (ref 0.1–1.0)
Monocytes Relative: 7 % (ref 3–12)
NEUTROS ABS: 4.7 10*3/uL (ref 1.7–7.7)
NEUTROS PCT: 61 % (ref 43–77)

## 2013-01-28 LAB — PSA: PSA: 6.48 ng/mL — ABNORMAL HIGH (ref <=4.00)

## 2013-01-28 NOTE — Progress Notes (Signed)
Labs drawn today for cbc/diff,bmp,mg,psa

## 2013-01-30 ENCOUNTER — Other Ambulatory Visit (HOSPITAL_COMMUNITY): Payer: Self-pay | Admitting: Hematology and Oncology

## 2013-01-30 ENCOUNTER — Ambulatory Visit (HOSPITAL_COMMUNITY)
Admission: RE | Admit: 2013-01-30 | Discharge: 2013-01-30 | Disposition: A | Discharge status: Home / Self Care | Payer: Medicare Other | Source: Ambulatory Visit | Attending: Hematology and Oncology | Admitting: Hematology and Oncology

## 2013-01-30 DIAGNOSIS — Z79818 Long term (current) use of other agents affecting estrogen receptors and estrogen levels: Secondary | ICD-10-CM

## 2013-01-30 DIAGNOSIS — M949 Disorder of cartilage, unspecified: Principal | ICD-10-CM

## 2013-01-30 DIAGNOSIS — C61 Malignant neoplasm of prostate: Secondary | ICD-10-CM

## 2013-01-30 DIAGNOSIS — M899 Disorder of bone, unspecified: Secondary | ICD-10-CM | POA: Insufficient documentation

## 2013-02-03 ENCOUNTER — Encounter (HOSPITAL_COMMUNITY): Payer: Medicare Other | Attending: Oncology

## 2013-02-03 ENCOUNTER — Encounter (HOSPITAL_COMMUNITY): Payer: Self-pay

## 2013-02-03 VITALS — BP 206/102 | HR 66 | Temp 97.8°F | Resp 18 | Wt 206.0 lb

## 2013-02-03 DIAGNOSIS — M8580 Other specified disorders of bone density and structure, unspecified site: Secondary | ICD-10-CM

## 2013-02-03 DIAGNOSIS — M899 Disorder of bone, unspecified: Secondary | ICD-10-CM

## 2013-02-03 DIAGNOSIS — C61 Malignant neoplasm of prostate: Secondary | ICD-10-CM | POA: Insufficient documentation

## 2013-02-03 DIAGNOSIS — I10 Essential (primary) hypertension: Secondary | ICD-10-CM

## 2013-02-03 DIAGNOSIS — M949 Disorder of cartilage, unspecified: Secondary | ICD-10-CM

## 2013-02-03 MED ORDER — BICALUTAMIDE 50 MG PO TABS: ORAL_TABLET | ORAL | Status: AC

## 2013-02-03 NOTE — Patient Instructions (Signed)
[place] Discharge Instructions  RECOMMENDATIONS MADE BY THE CONSULTANT AND ANY TEST RESULTS WILL BE SENT TO YOUR REFERRING PHYSICIAN.  EXAM FINDINGS BY THE PHYSICIAN TODAY AND SIGNS OR SYMPTOMS TO REPORT TO CLINIC OR PRIMARY PHYSICIAN: Exam and findings as discussed by Dr. Barnet Glasgow.  Please schedule bone scan. Return in 1 month for blood work (PSA and Testosterone levels).  Return in April for follow-up visit with doctor and repeat blood work at that time (CBC, CMET, PSA, and Testosterone).    Thank you for choosing [facility] to provide your oncology and hematology care.  To afford each patient quality time with our providers, please arrive at least 15 minutes before your scheduled appointment time.  With your help, our goal is to use those 15 minutes to complete the necessary work-up to ensure our physicians have the information they need to help with your evaluation and healthcare recommendations.    Effective January 1st, 2014, we ask that you re-schedule your appointment with our physicians should you arrive 10 or more minutes late for your appointment.  We strive to give you quality time with our providers, and arriving late affects you and other patients whose appointments are after yours.    Again, thank you for choosing [facility].  Our hope is that these requests will decrease the amount of time that you wait before being seen by our physicians.       _____________________________________________________________  Should you have questions after your visit to [facility], please contact our office at [phone] between the hours of 8:30 a.m. and 5:00 p.m.  Voicemails left after 4:30 p.m. will not be returned until the following business day.  For prescription refill requests, have your pharmacy contact our office with your prescription refill request.

## 2013-02-03 NOTE — Progress Notes (Signed)
[place]  OFFICE PROGRESS NOTE  [facility], MD [place] [address]  DIAGNOSIS: Prostate ca - Plan: NM Bone Scan Whole Body, bicalutamide (CASODEX) 50 MG tablet, PSA, Testosterone  Osteopenia  Chief Complaint  Patient presents with  . Follow-up  . Prostate Cancer    PSA only recurrence on LHRH agonist plus Casodex 50 mg daily    CURRENT THERAPY: LHRH agonist every 6 months plus Casodex 50 mg daily  INTERVAL HISTORY: Shane Hall 79 y.o. male returns for followup of PS only recurrent prostate cancer while receiving LHRH agonist therapy every 6 months and taking Casodex 50 mg daily. Bone scan on 06/23/2012 was normal. Appetite is good with no nausea or vomiting. Hot flashes have diminished as has breast tenderness. He denies any fever, night sweats, bone pain, diarrhea, melena, hematochezia, hematuria, urinary hesitancy, cough, wheezing, lower extremity swelling or redness, headache, or seizures. He has been constipated and uses stool softeners.  MEDICAL HISTORY: Past Medical History  Diagnosis Date  . Hypertension   . Thyroid disease   . Prostate cancer   . Glaucoma   . Borderline diabetes   . Osteopenia   . Prostate ca 04/23/2011  . High cholesterol     INTERIM HISTORY: has Influenza vaccine needed; Prostate ca; Fecal impaction; Hypokalemia; Essential hypertension, benign; Unspecified hypothyroidism; External hemorrhoids; Other and unspecified hyperlipidemia; and Glaucoma on his problem list.    ALLERGIES:  is allergic to phenytoin sodium extended.  MEDICATIONS: has a current medication list which includes the following prescription(s): amlodipine, aspirin ec, bicalutamide, calcium carbonate-vitamin d, dss, dorzolamide-timolol, levothyroxine, lisinopril-hydrochlorothiazide, magnesium gluconate, metoprolol, multivitamin, fish oil, polyethylene glycol, potassium chloride sa, pravastatin, triptorelin pamoate, alendronate, and  niacin.  SURGICAL HISTORY:  Past Surgical History  Procedure Laterality Date  . Tonsillectomy    . Prostate biopsy    . Brain meningioma excision    . Cataract extraction w/ intraocular lens implant Left 09/2012    FAMILY HISTORY: family history includes Hypertension in his brother, father, paternal aunt, and paternal uncle.  SOCIAL HISTORY:  reports that he has never smoked. He has never used smokeless tobacco. He reports that he drinks alcohol. He reports that he does not use illicit drugs.  REVIEW OF SYSTEMS:  Other than that discussed above is noncontributory.  PHYSICAL EXAMINATION: ECOG PERFORMANCE STATUS: 1 - Symptomatic but completely ambulatory  Blood pressure 206/102, pulse 66, temperature 97.8 F (36.6 C), temperature source Oral, resp. rate 18, weight 206 lb (93.441 kg).  GENERAL:alert, no distress and comfortable SKIN: skin color, texture, turgor are normal, no rashes or significant lesions EYES: PERLA; Conjunctiva are pink and non-injected, sclera clear OROPHARYNX:no exudate, no erythema on lips, buccal mucosa, or tongue. NECK: supple, thyroid normal size, non-tender, without nodularity. No masses CHEST: Bilateral gynecomastia with no skin rashes. LYMPH:  no palpable lymphadenopathy in the cervical, axillary or inguinal LUNGS: clear to auscultation and percussion with normal breathing effort HEART: regular rate & rhythm and no murmurs. ABDOMEN:abdomen soft, non-tender and normal bowel sounds MUSCULOSKELETAL:no cyanosis of digits and no clubbing. Range of motion normal.  NEURO: alert & oriented x 3 with fluent speech, no focal motor/sensory deficits   LABORATORY DATA: Infusion on 01/28/2013  Component Date Value Range Status  . Neutrophils Relative % 01/28/2013 61  43 - 77 % Final  . Neutro Abs 01/28/2013 4.7  1.7 - 7.7 K/uL Final  . Lymphocytes Relative 01/28/2013 28  12 - 46 % Final  .  Lymphs Abs 01/28/2013 2.1  0.7 - 4.0 K/uL Final  . Monocytes Relative  01/28/2013 7  3 - 12 % Final  . Monocytes Absolute 01/28/2013 0.5  0.1 - 1.0 K/uL Final  . Eosinophils Relative 01/28/2013 4  0 - 5 % Final  . Eosinophils Absolute 01/28/2013 0.3  0.0 - 0.7 K/uL Final  . Basophils Relative 01/28/2013 0  0 - 1 % Final  . Basophils Absolute 01/28/2013 0.0  0.0 - 0.1 K/uL Final  . Sodium 01/28/2013 142  137 - 147 mEq/L Final  . Potassium 01/28/2013 3.7  3.7 - 5.3 mEq/L Final  . Chloride 01/28/2013 100  96 - 112 mEq/L Final  . CO2 01/28/2013 32  19 - 32 mEq/L Final  . Glucose, Bld 01/28/2013 104* 70 - 99 mg/dL Final  . BUN 77/37/3668 19  6 - 23 mg/dL Final  . Creatinine, Ser 01/28/2013 1.22  0.50 - 1.35 mg/dL Final  . Calcium 15/94/7076 9.7  8.4 - 10.5 mg/dL Final  . GFR calc non Af Amer 01/28/2013 56* >90 mL/min Final  . GFR calc Af Amer 01/28/2013 65* >90 mL/min Final   Comment: (NOTE)                          The eGFR has been calculated using the CKD EPI equation.                          This calculation has not been validated in all clinical situations.                          eGFR's persistently <90 mL/min signify possible Chronic Kidney                          Disease.  . Magnesium 01/28/2013 2.0  1.5 - 2.5 mg/dL Final  . PSA 15/18/3437 6.48* <=4.00 ng/mL Final   Comment: (NOTE)                          Test Methodology: ECLIA PSA (Electrochemiluminescence Immunoassay)                          For PSA values from 2.5-4.0, particularly in younger men <60 years                          old, the AUA and NCCN suggest testing for % Free PSA (3515) and                          evaluation of the rate of increase in PSA (PSA velocity).                          Performed at [facility]  . WBC 01/28/2013 7.7  4.0 - 10.5 K/uL Final  . RBC 01/28/2013 4.14* 4.22 - 5.81 MIL/uL Final  . Hemoglobin 01/28/2013 13.7  13.0 - 17.0 g/dL Final  . HCT 35/78/9784 40.3  39.0 - 52.0 % Final  . MCV 01/28/2013 97.3  78.0 - 100.0 fL Final  . MCH 01/28/2013 33.1   26.0 - 34.0 pg Final  . MCHC 01/28/2013 34.0  30.0 - 36.0 g/dL Final  .  RDW 01/28/2013 13.3  11.5 - 15.5 % Final  . Platelets 01/28/2013 192  150 - 400 K/uL Final    PATHOLOGY: No new pathology.  Urinalysis No results found for this basename: colorurine,  appearanceur,  labspec,  phurine,  glucoseu,  hgbur,  bilirubinur,  ketonesur,  proteinur,  urobilinogen,  nitrite,  leukocytesur    RADIOGRAPHIC STUDIES: Dg Bone Density  01/30/2013   EXAM: DG DEXA AXIAL SKELETON  The Bone Mineral Densitometry hard-copy report (which includes all data, graphical display, and FRAX results when applicable) has been sent directly to the ordering physician.  This report can also be obtained electronically by viewing images for this exam through the performing facility's EMR, or by logging directly into [employer].   Electronically Signed   By: Lavonia Dana M.D.   On: 01/30/2013 13:40    ASSESSMENT:  #1. PSA only recurrence of prostate cancer, worsening PSA now up to 6.48. #2. Osteopenia on repeat bone marrow density, no longer taking Fosamax because of jaw pain which improved when he discontinue the drug. #3. Hypertension, not controlled.   PLAN:  #1. Take antihypertensives every morning. #2. Increase Casodex to 100 mg daily. #3. Whole body bone scan compared to June of 2014. #4. Followup in 2 months for physical exam and lab tests along with Trelstar injection. #5. Lab tests in one month with PSA and testosterone.   All questions were answered. The patient knows to call the clinic with any problems, questions or concerns. We can certainly see the patient much sooner if necessary.   I spent 25 minutes counseling the patient face to face. The total time spent in the appointment was 30 minutes.    Doroteo Bradford, MD 02/03/2013 11:01 AM

## 2013-02-06 ENCOUNTER — Encounter (HOSPITAL_COMMUNITY): Payer: Self-pay

## 2013-02-06 ENCOUNTER — Encounter (HOSPITAL_COMMUNITY)
Admission: RE | Admit: 2013-02-06 | Discharge: 2013-02-06 | Disposition: A | Discharge status: Home / Self Care | Payer: Medicare Other | Source: Ambulatory Visit | Attending: Hematology and Oncology | Admitting: Hematology and Oncology

## 2013-02-06 DIAGNOSIS — C61 Malignant neoplasm of prostate: Secondary | ICD-10-CM | POA: Insufficient documentation

## 2013-02-06 MED ORDER — TECHNETIUM TC 99M MEDRONATE IV KIT
25.0000 | PACK | Freq: Once | INTRAVENOUS | Status: AC | PRN
  Administered 2013-02-06: 25 via INTRAVENOUS

## 2013-03-03 ENCOUNTER — Other Ambulatory Visit (HOSPITAL_COMMUNITY): Payer: Medicare Other

## 2013-03-12 ENCOUNTER — Other Ambulatory Visit (HOSPITAL_COMMUNITY): Payer: Self-pay | Admitting: Oncology

## 2013-04-22 ENCOUNTER — Ambulatory Visit (HOSPITAL_COMMUNITY): Payer: Medicare Other

## 2013-09-06 IMAGING — CT CT ABD-PELV W/ CM
2 of 4 series · 12 of 36 positions shown, 15 images · IV contrast (Omnipaque 300)
Comparison: None

CT CHEST
COMPARISON: None

CT CHEST

<!--  IDXRADR:ADDEND:BEGIN -->Addendum Begins
<!--  IDXRADR:ADDEND:INNER_BEGIN -->***ADDENDUM*** CREATED: 01/24/2011 [DATE]

A remote comparison was made available from the [REDACTED]
from August 2007.  In  comparison, the pelvic lymph nodes and
retrocrural lymph node described below are not changed in size in
the interval consistent with stable disease.
Findings discussed with Dr. Joshua on 01/22/2011.
***END ADDENDUM*** SIGNED BY: Nu Reinhard, M.D.
CLINICAL DATA: Prostate cancer diagnosed 380 and half years ago
with metastatic the these to 11 volts.  Patient on hormone therapy.
CT CHEST, ABDOMEN AND PELVIS WITH CONTRAST
TECHNIQUE: Multidetector CT imaging of the chest, abdomen and
pelvis was performed following the standard protocol during bolus
administration of intravenous contrast.
Contrast:

[Series 2: cap with 5.0 b40f · axial · 0.70mm/px · z∈[-660,-140]mm · 9 of 132 slices shown, 12 images]
[im 14/132  mediastinal]
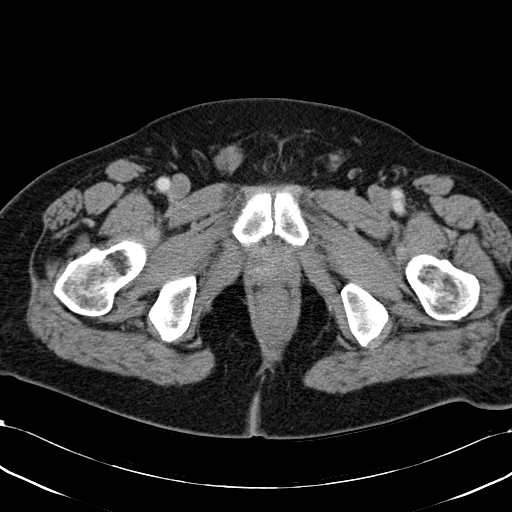
[im 14/132  lung]
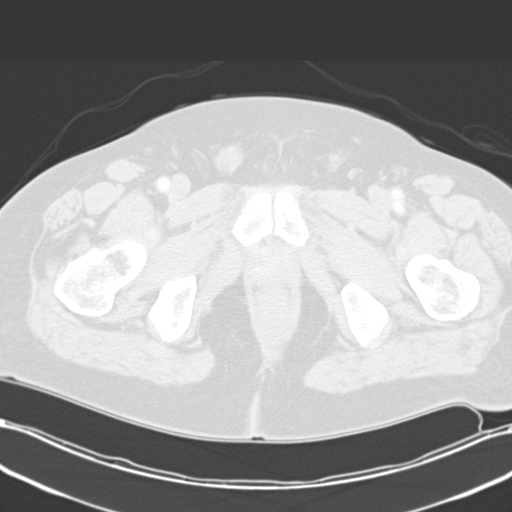
[im 27/132  lung]
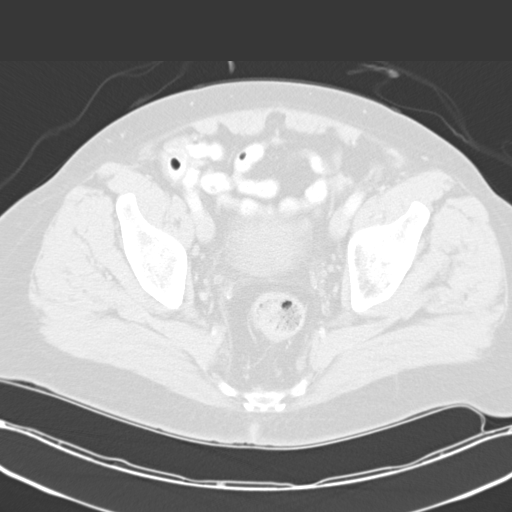
[im 40/132  lung]
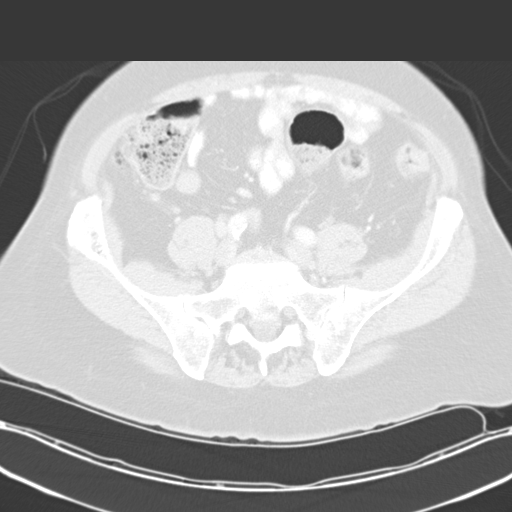
[im 53/132  lung]
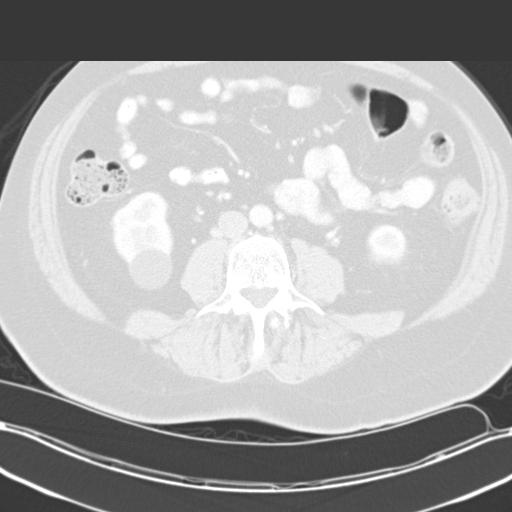
[im 66/132  mediastinal]
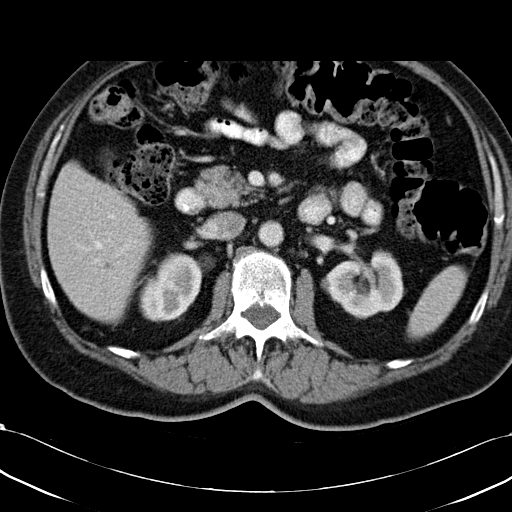
[im 66/132  lung]
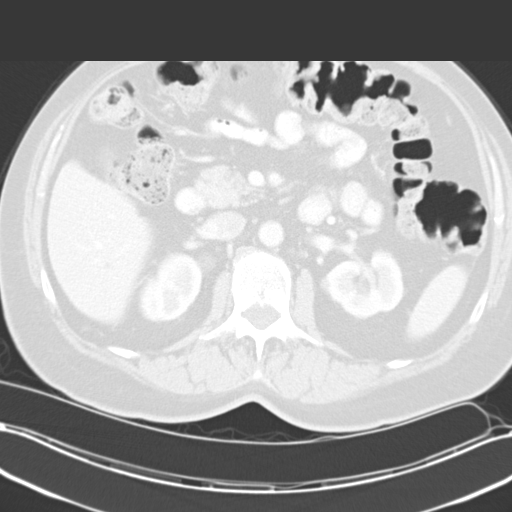
[im 79/132  lung]
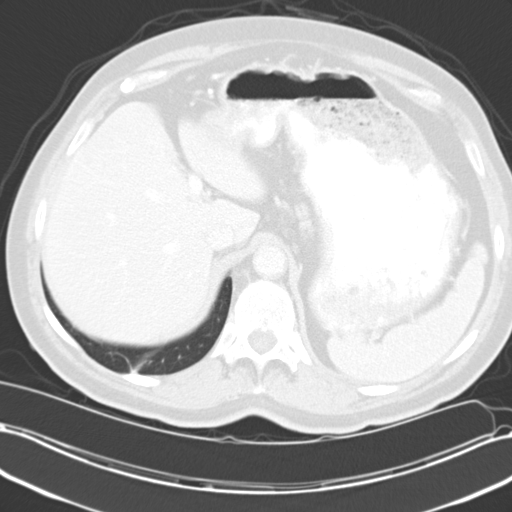
[im 92/132  lung]
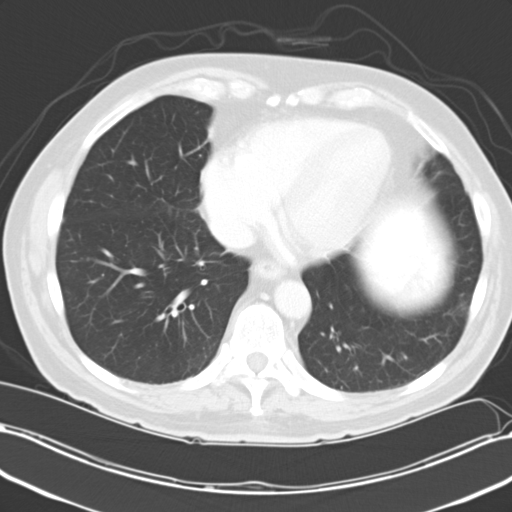
[im 105/132  lung]
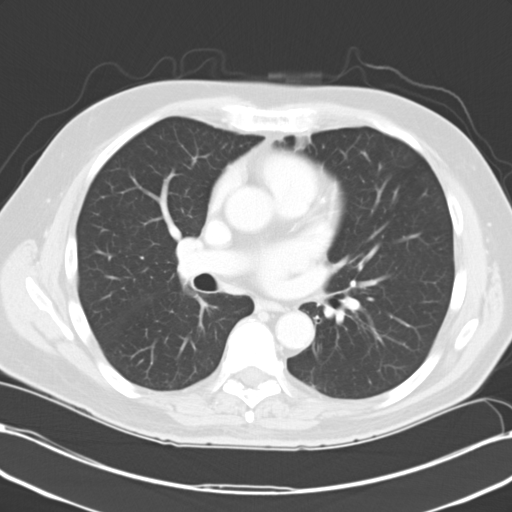
[im 118/132  mediastinal]
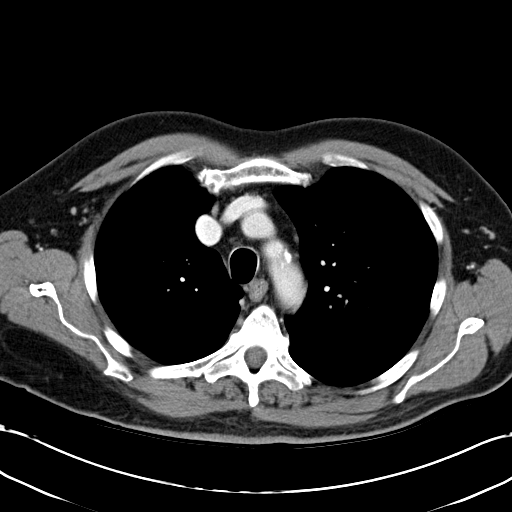
[im 118/132  lung]
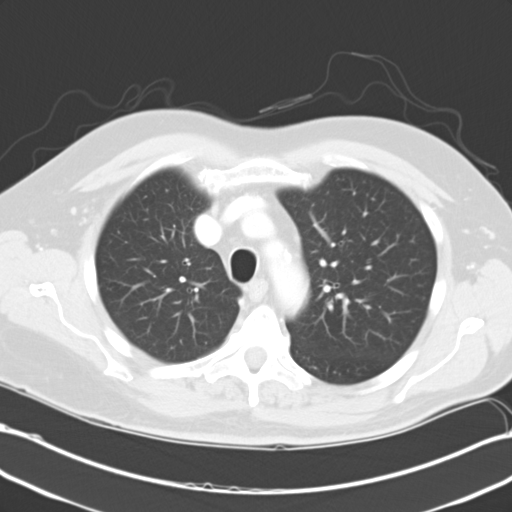

[Series 4: mpr cor post contrast (id) · coronal · 0.70mm/px · 3 of 95 slices shown]
[im 19/95  lung]
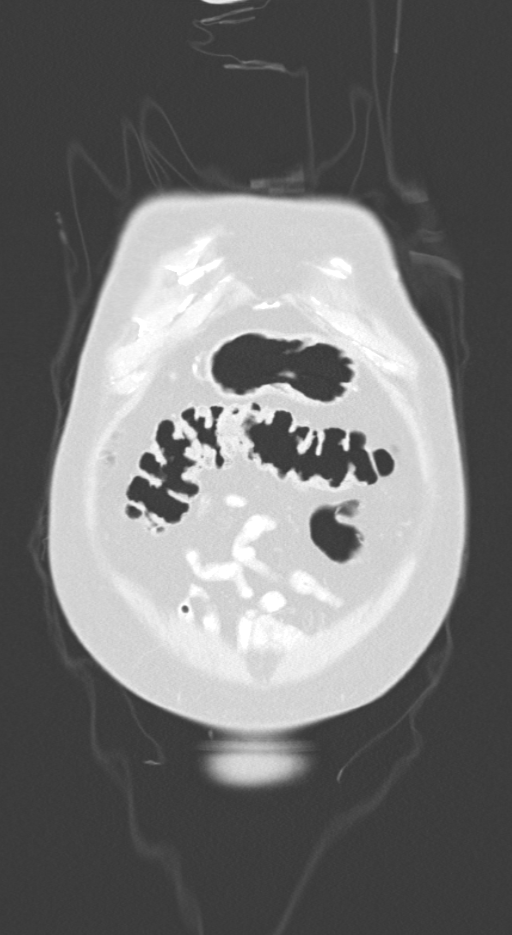
[im 38/95  lung]
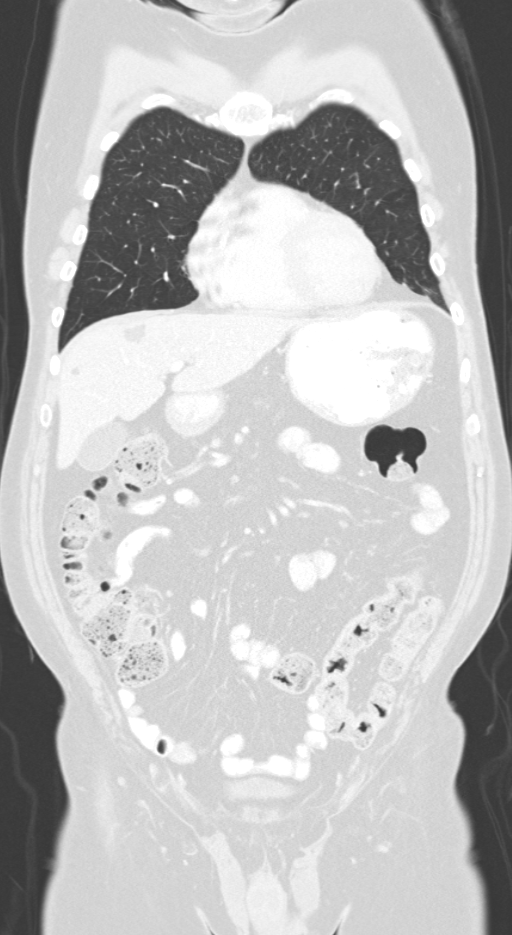
[im 57/95  lung]
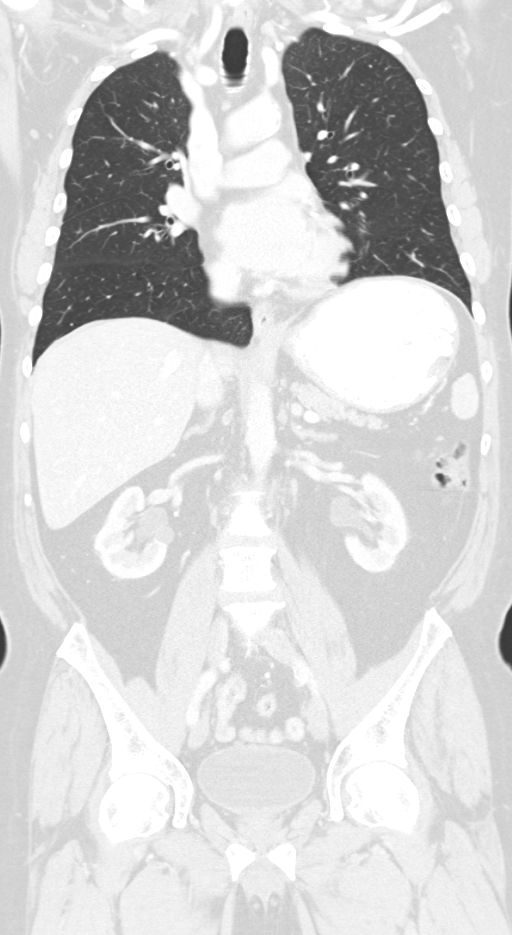

[12 of 36 positions shown; findings below may reference images not displayed]

FINDINGS: No axillary or supraclavicular lymphadenopathy.  No
mediastinal or hilar lymphadenopathy.  No pericardial fluid.

There is no suspicious pulmonary nodules.
IMPRESSION: No evidence of thoracic metastasis appear

CT ABDOMEN AND PELVIS
FINDINGS: And there are several small hypodense lesions scattered
throughout the liver.  The largest of these has simple fluid
attenuation as simple cyst.  The small ones are too small to
characterize.  The gallbladder, pancreas, spleen, adrenal glands,
and kidneys are normal.  There is a simple cyst extending from
lower pole of the right kidney.

There is a calcification on the posterior wall of the stomach
(image 48) which appears benign.  The small bowel and colon are
normal.

Abdominal aorta normal caliber. There is a 6 mm lymph node right of
the IVC at the level of the right kidney (image 80).  There is a 7
mm retrocrural lymph node (image 50).

No free fluid the pelvis.  The bladder, prostate, seminal vesicles
appear normal.

There is a 9 mm short axis right common iliac lymph node (image 93)
and a 9 mm left common iliac lymph node (image 103).

Review of the bone windows demonstrates no aggressive osseous
lesions.  Small sclerotic lesion within the anterior left
acetabulum appears benign.
IMPRESSION: 1. Small pelvic lymph nodes likely represent local nodal
metastasis.

3. Small retroperitoneal and retrocrural lymph nodes are
indeterminate but suspicious for metastasis..

3..  Multiple benign appearing hepatic cyst.
<!--  IDXRADR:ADDEND:INNER_END -->Addendum Ends
<!--  IDXRADR:ADDEND:END -->*RADIOLOGY REPORT*
FINDINGS: No axillary or supraclavicular lymphadenopathy.  No
mediastinal or hilar lymphadenopathy.  No pericardial fluid.

There is no suspicious pulmonary nodules.
IMPRESSION: No evidence of thoracic metastasis appear

CT ABDOMEN AND PELVIS
FINDINGS: And there are several small hypodense lesions scattered
throughout the liver.  The largest of these has simple fluid
attenuation as simple cyst.  The small ones are too small to
characterize.  The gallbladder, pancreas, spleen, adrenal glands,
and kidneys are normal.  There is a simple cyst extending from
lower pole of the right kidney.

There is a calcification on the posterior wall of the stomach
(image 48) which appears benign.  The small bowel and colon are
normal.

Abdominal aorta normal caliber. There is a 6 mm lymph node right of
the IVC at the level of the right kidney (image 80).  There is a 7
mm retrocrural lymph node (image 50).

No free fluid the pelvis.  The bladder, prostate, seminal vesicles
appear normal.

There is a 9 mm short axis right common iliac lymph node (image 93)
and a 9 mm left common iliac lymph node (image 103).

Review of the bone windows demonstrates no aggressive osseous
lesions.  Small sclerotic lesion within the anterior left
acetabulum appears benign.
IMPRESSION: 1. Small pelvic lymph nodes likely represent local nodal
metastasis.

3. Small retroperitoneal and retrocrural lymph nodes are
indeterminate but suspicious for metastasis..

3..  Multiple benign appearing hepatic cyst.

## 2013-11-27 ENCOUNTER — Emergency Department (HOSPITAL_COMMUNITY)
Admission: EM | Admit: 2013-11-27 | Discharge: 2013-11-27 | Disposition: A | Discharge status: Home / Self Care | Payer: Medicare Other | Attending: Emergency Medicine | Admitting: Emergency Medicine

## 2013-11-27 ENCOUNTER — Encounter (HOSPITAL_COMMUNITY): Payer: Self-pay | Admitting: Emergency Medicine

## 2013-11-27 ENCOUNTER — Emergency Department (HOSPITAL_COMMUNITY): Payer: Medicare Other

## 2013-11-27 DIAGNOSIS — Z79899 Other long term (current) drug therapy: Secondary | ICD-10-CM | POA: Diagnosis not present

## 2013-11-27 DIAGNOSIS — E079 Disorder of thyroid, unspecified: Secondary | ICD-10-CM | POA: Diagnosis not present

## 2013-11-27 DIAGNOSIS — Y9289 Other specified places as the place of occurrence of the external cause: Secondary | ICD-10-CM | POA: Insufficient documentation

## 2013-11-27 DIAGNOSIS — Z23 Encounter for immunization: Secondary | ICD-10-CM | POA: Diagnosis not present

## 2013-11-27 DIAGNOSIS — Y998 Other external cause status: Secondary | ICD-10-CM | POA: Insufficient documentation

## 2013-11-27 DIAGNOSIS — Z8546 Personal history of malignant neoplasm of prostate: Secondary | ICD-10-CM | POA: Diagnosis not present

## 2013-11-27 DIAGNOSIS — I10 Essential (primary) hypertension: Secondary | ICD-10-CM | POA: Insufficient documentation

## 2013-11-27 DIAGNOSIS — S0990XA Unspecified injury of head, initial encounter: Secondary | ICD-10-CM | POA: Diagnosis not present

## 2013-11-27 DIAGNOSIS — W1839XA Other fall on same level, initial encounter: Secondary | ICD-10-CM | POA: Insufficient documentation

## 2013-11-27 DIAGNOSIS — S022XXA Fracture of nasal bones, initial encounter for closed fracture: Secondary | ICD-10-CM

## 2013-11-27 DIAGNOSIS — Z7982 Long term (current) use of aspirin: Secondary | ICD-10-CM | POA: Diagnosis not present

## 2013-11-27 DIAGNOSIS — W19XXXA Unspecified fall, initial encounter: Secondary | ICD-10-CM

## 2013-11-27 DIAGNOSIS — Y9389 Activity, other specified: Secondary | ICD-10-CM | POA: Diagnosis not present

## 2013-11-27 LAB — BASIC METABOLIC PANEL
Anion gap: 13 (ref 5–15)
BUN: 22 mg/dL (ref 6–23)
CO2: 27 mEq/L (ref 19–32)
Calcium: 9.4 mg/dL (ref 8.4–10.5)
Chloride: 102 mEq/L (ref 96–112)
Creatinine, Ser: 1.31 mg/dL (ref 0.50–1.35)
GFR calc non Af Amer: 51 mL/min — ABNORMAL LOW (ref >90)
GFR, EST AFRICAN AMERICAN: 59 mL/min — AB (ref >90)
Glucose, Bld: 122 mg/dL — ABNORMAL HIGH (ref 70–99)
Potassium: 4.2 mEq/L (ref 3.7–5.3)
Sodium: 142 mEq/L (ref 137–147)

## 2013-11-27 LAB — CBC WITH DIFFERENTIAL/PLATELET
BASOS PCT: 0 % (ref 0–1)
Basophils Absolute: 0 10*3/uL (ref 0.0–0.1)
Eosinophils Absolute: 0.1 10*3/uL (ref 0.0–0.7)
Eosinophils Relative: 1 % (ref 0–5)
HEMATOCRIT: 40.8 % (ref 39.0–52.0)
HEMOGLOBIN: 13.6 g/dL (ref 13.0–17.0)
Lymphocytes Relative: 14 % (ref 12–46)
Lymphs Abs: 1.4 10*3/uL (ref 0.7–4.0)
MCH: 33.5 pg (ref 26.0–34.0)
MCHC: 33.3 g/dL (ref 30.0–36.0)
MCV: 100.5 fL — ABNORMAL HIGH (ref 78.0–100.0)
MONO ABS: 0.5 10*3/uL (ref 0.1–1.0)
Monocytes Relative: 6 % (ref 3–12)
NEUTROS ABS: 7.5 10*3/uL (ref 1.7–7.7)
Neutrophils Relative %: 79 % — ABNORMAL HIGH (ref 43–77)
Platelets: 189 10*3/uL (ref 150–400)
RBC: 4.06 MIL/uL — ABNORMAL LOW (ref 4.22–5.81)
RDW: 13.7 % (ref 11.5–15.5)
WBC: 9.5 10*3/uL (ref 4.0–10.5)

## 2013-11-27 LAB — URINALYSIS, ROUTINE W REFLEX MICROSCOPIC
Bilirubin Urine: NEGATIVE
Glucose, UA: NEGATIVE mg/dL
Ketones, ur: NEGATIVE mg/dL
Leukocytes, UA: NEGATIVE
Nitrite: NEGATIVE
PROTEIN: 100 mg/dL — AB
SPECIFIC GRAVITY, URINE: 1.025 (ref 1.005–1.030)
Urobilinogen, UA: 0.2 mg/dL (ref 0.0–1.0)
pH: 7 (ref 5.0–8.0)

## 2013-11-27 LAB — URINE MICROSCOPIC-ADD ON

## 2013-11-27 LAB — TROPONIN I: Troponin I: <0.3 ng/mL (ref <0.30)

## 2013-11-27 MED ORDER — TETANUS-DIPHTH-ACELL PERTUSSIS 5-2.5-18.5 LF-MCG/0.5 IM SUSP
0.5000 mL | Freq: Once | INTRAMUSCULAR | Status: AC
  Administered 2013-11-27: 0.5 mL via INTRAMUSCULAR
  Filled 2013-11-27: qty 0.5

## 2013-11-27 NOTE — ED Provider Notes (Signed)
This chart was scribed for [place], DO by Randa Evens, ED Scribe. This patient was seen in room APA03/APA03 and the patient's care was started at 4:57 PM.   TIME SEEN: 4:58 PM  CHIEF COMPLAINT: Fall   HPI:  Shane Hall is a 79 y.o. male with history of hypertension, hypothyroidism, hyperlipidemia, prostate cancer, prior history of a right-sided hemangioma status post resection in 2006 who presents to the Emergency Department complaining of fall onset today PTA. He presents with multiple abrasion to the face. He states that he was walking today and out of nowhere states that he had to go down. He states that he felt himself leaning forward as he was walking. Pt states that he fell onto concrete. No loss of consciousness. Not on anticoagulation. Denies CP, SOB, fever, vomiting, diarrhea or dizziness. No numbness, tingling or focal weakness.  He states that he has started taking new medication called zytiga for his prostate cancer he started this medication 8 months ago. No other new medications.  He states that his tetanus status is unknown.     ROS: See HPI Constitutional: no fever  Eyes: no drainage  ENT: no runny nose   Cardiovascular:  no chest pain  Resp: no SOB  GI: no vomiting GU: no dysuria Integumentary: no rash  Allergy: no hives  Musculoskeletal: no leg swelling  Neurological: no slurred speech ROS otherwise negative  PAST MEDICAL HISTORY/PAST SURGICAL HISTORY:  Past Medical History  Diagnosis Date  . Hypertension   . Thyroid disease   . Prostate cancer   . Glaucoma   . Borderline diabetes   . Osteopenia   . Prostate ca 04/23/2011  . High cholesterol     MEDICATIONS:  Prior to Admission medications   Medication Sig Start Date End Date Taking? Authorizing Provider  amLODipine (NORVASC) 10 MG tablet Take 10 mg by mouth at bedtime.      Historical Provider, MD  aspirin EC 81 MG tablet Take 81 mg by mouth daily.    Historical Provider, MD  bicalutamide  (CASODEX) 50 MG tablet Take two tablets daily. 02/03/13   Farrel Gobble, MD  Calcium Carbonate-Vitamin D (CALCIUM 600+D) 600-400 MG-UNIT per tablet Take 1 tablet by mouth 2 (two) times daily.    Historical Provider, MD  docusate sodium 100 MG CAPS Take 100 mg by mouth daily. 03/03/12   Delfina Redwood, MD  dorzolamide-timolol (COSOPT) 22.3-6.8 MG/ML ophthalmic solution Apply 1 drop to eye 2 (two) times daily. 06/12/12   Historical Provider, MD  levothyroxine (SYNTHROID, LEVOTHROID) 112 MCG tablet Take 112 mcg by mouth daily.      Historical Provider, MD  lisinopril-hydrochlorothiazide (PRINZIDE,ZESTORETIC) 20-25 MG per tablet 2 tablets daily.  07/16/11   Historical Provider, MD  magnesium gluconate (MAGONATE) 500 MG tablet Take 500 mg by mouth daily.      Historical Provider, MD  metoprolol (LOPRESSOR) 100 MG tablet Take 50 mg by mouth 2 (two) times daily. Takes 1/2 tablet twice daily    Historical Provider, MD  Multiple Vitamin (MULTIVITAMIN) tablet Take 1 tablet by mouth daily.      Historical Provider, MD  niacin 250 MG tablet Take 250 mg by mouth daily with breakfast.     Historical Provider, MD  Omega-3 Fatty Acids (FISH OIL) 1200 MG CAPS Take 1 capsule by mouth 2 (two) times daily.    Historical Provider, MD  polyethylene glycol (MIRALAX / GLYCOLAX) packet Take 17 g by mouth as needed. 03/03/12   Corinna  Greggory Keen, MD  potassium chloride SA (K-DUR,KLOR-CON) 20 MEQ tablet TAKE ONE TABLET BY MOUTH FIVE TIMES DAILY 03/12/13   Baird Cancer, PA-C  pravastatin (PRAVACHOL) 20 MG tablet Take 20 mg by mouth Daily. 12/02/11   Historical Provider, MD  Triptorelin Pamoate (TRELSTAR MIXJECT) 22.5 MG injection Inject 22.5 mg into the muscle every 6 (six) months.    Historical Provider, MD    ALLERGIES:  Allergies  Allergen Reactions  . 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 Smoking status: Never Smoker    . Smokeless tobacco: Never Used  . Alcohol Use: Yes     Comment: occasional glass of wine or beer with a meal    FAMILY HISTORY: Family History  Problem Relation Age of Onset  . Hypertension Father   . Hypertension Brother   . Hypertension Paternal Aunt   . Hypertension Paternal Uncle     EXAM: BP 203/85 mmHg  Pulse 73  Temp(Src) 98.1 F (36.7 C) (Oral)  Resp 20  Ht 5\' 10"  (1.778 m)  Wt 196 lb (88.905 kg)  BMI 28.12 kg/m2  SpO2 100%   CONSTITUTIONAL: Alert and oriented and responds appropriately to questions. Well-appearing; well-nourished; GCS 15, extremely pleasant, smiling and joking HEAD: Multiple areas of abrasion to the face, swelling to upper lip EYES: Conjunctivae clear, PERRL, EOMI ENT: normal nose; no rhinorrhea; moist mucous membranes; pharynx without lesions noted; no dental injury; ; no septal hematoma NECK: Supple, no meningismus, no LAD; no midline spinal tenderness, step-off or deformity CARD: RRR; S1 and S2 appreciated; no murmurs, no clicks, no rubs, no gallops RESP: Normal chest excursion without splinting or tachypnea; breath sounds clear and equal bilaterally; no wheezes, no rhonchi, no rales; chest wall stable, nontender to palpation ABD/GI: Normal bowel sounds; non-distended; soft, non-tender, no rebound, no guarding PELVIS:  stable, nontender to palpation BACK:  The back appears normal and is non-tender to palpation, there is no CVA tenderness; no midline spinal tenderness, step-off or deformity EXT: Normal ROM in all joints; non-tender to palpation; no edema; normal capillary refill; no cyanosis    SKIN: Normal color for age and race; warm NEURO: Moves all extremities equally; sensation to light touch intact diffusely, cranial nerves II through XII intact PSYCH: The patient's mood and manner are appropriate. Grooming and personal hygiene are appropriate.  MEDICAL DECISION MAKING: Pt here after he had a fall. He is not sure why he fell. Will obtain  labs, urine to evaluate for potential organic causes for his fall. Will obtain CT of his head, cervical spine and face. Will update tetanus. He denies wanting any pain medication at this time.  He is hypertensive, will monitor this.  EKG is nonischemic, unchanged compared to 2006.  ED PROGRESS: Patient's labs are unremarkable.  Urine shows trace hemoglobin and few bacteria but no other sign of infection. Culture pending. Troponin negative. CT head shows no acute intracranial abnormality. He is status post right frontal temporal craniotomy with encephalomalacia versus postsurgical in nature. No new infarcts. There is a mild depressed fracture of the left nasal bone. No other injury noted. He has been able to angulate without difficulty. He is still neurologically intact. We standing his blood pressure does drop from 734L systolic to 937T but he is not symptomatic. I feel he is safe to be discharged home. Have discussed return precautions including head injury return precautions with patient and  family at bedside and importance of outpatient follow-up. They verbalize understanding and are comfortable with plan.    EKG Interpretation  Date/Time:  Friday November 27 2013 17:16:55 EST Ventricular Rate:  69 PR Interval:  206 QRS Duration: 77 QT Interval:  394 QTC Calculation: 422 R Axis:   32 Text Interpretation:  Sinus rhythm Borderline ST depression, diffuse leads Minimal ST elevation, anterior leads No significant change since 2006 Confirmed by WARD,  DO, KRISTEN [phone]) on 11/27/2013 5:21:25 PM        I personally performed the services described in this documentation, which was scribed in my presence. The recorded information has been reviewed and is accurate.    [place], DO 11/27/13 1851

## 2013-11-27 NOTE — Discharge Instructions (Signed)
Head Injury °You have received a head injury. It does not appear serious at this time. Headaches and vomiting are common following head injury. It should be easy to awaken from sleeping. Sometimes it is necessary for you to stay in the emergency department for a while for observation. Sometimes admission to the hospital may be needed. After injuries such as yours, most problems occur within the first 24 hours, but side effects may occur up to 7-10 days after the injury. It is important for you to carefully monitor your condition and contact your health care provider or seek immediate medical care if there is a change in your condition. °WHAT ARE THE TYPES OF HEAD INJURIES? °Head injuries can be as minor as a bump. Some head injuries can be more severe. More severe head injuries include: °· A jarring injury to the brain (concussion). °· A bruise of the brain (contusion). This mean there is bleeding in the brain that can cause swelling. °· A cracked skull (skull fracture). °· Bleeding in the brain that collects, clots, and forms a bump (hematoma). °WHAT CAUSES A HEAD INJURY? °A serious head injury is most likely to happen to someone who is in a car wreck and is not wearing a seat belt. Other causes of major head injuries include bicycle or motorcycle accidents, sports injuries, and falls. °HOW ARE HEAD INJURIES DIAGNOSED? °A complete history of the event leading to the injury and your current symptoms will be helpful in diagnosing head injuries. Many times, pictures of the brain, such as CT or MRI are needed to see the extent of the injury. Often, an overnight hospital stay is necessary for observation.  °WHEN SHOULD I SEEK IMMEDIATE MEDICAL CARE?  °You should get help right away if: °· You have confusion or drowsiness. °· You feel sick to your stomach (nauseous) or have continued, forceful vomiting. °· You have dizziness or unsteadiness that is getting worse. °· You have severe, continued headaches not relieved by  medicine. Only take over-the-counter or prescription medicines for pain, fever, or discomfort as directed by your health care provider. °· You do not have normal function of the arms or legs or are unable to walk. °· You notice changes in the black spots in the center of the colored part of your eye (pupil). °· You have a clear or bloody fluid coming from your nose or ears. °· You have a loss of vision. °During the next 24 hours after the injury, you must stay with someone who can watch you for the warning signs. This person should contact local emergency services (911 in the [place] if you have seizures, you become unconscious, or you are unable to wake up. °HOW CAN I PREVENT A HEAD INJURY IN THE FUTURE? °The most important factor for preventing major head injuries is avoiding motor vehicle accidents.  To minimize the potential for damage to your head, it is crucial to wear seat belts while riding in motor vehicles. Wearing helmets while bike riding and playing collision sports (like football) is also helpful. Also, avoiding dangerous activities around the house will further help reduce your risk of head injury.  °WHEN CAN I RETURN TO NORMAL ACTIVITIES AND ATHLETICS? °You should be reevaluated by your health care provider before returning to these activities. If you have any of the following symptoms, you should not return to activities or contact sports until 1 week after the symptoms have stopped: °· Persistent headache. °· Dizziness or vertigo. °· Poor attention and concentration. °· Confusion. °·   Memory problems.  Nausea or vomiting.  Fatigue or tire easily.  Irritability.  Intolerant of bright lights or loud noises.  Anxiety or depression.  Disturbed sleep. MAKE SURE YOU:   Understand these instructions.  Will watch your condition.  Will get help right away if you are not doing well or get worse. Document Released: 12/18/2004 Document Revised: 12/23/2012 Document Reviewed:  08/25/2012 [facility] Patient Information 2015 [place], [place]. This information is not intended to replace advice given to you by your health care provider. Make sure you discuss any questions you have with your health care provider.  Nasal Fracture A nasal fracture is a break or crack in the bones of the nose. A minor break usually heals in a month. You often will receive black eyes from a nasal fracture. This is not a cause for concern. The black eyes will go away over 1 to 2 weeks.  DIAGNOSIS  Your caregiver may want to examine you if you are concerned about a fracture of the nose. X-rays of the nose may not show a nasal fracture even when one is present. Sometimes your caregiver must wait 1 to 5 days after the injury to re-check the nose for alignment and to take additional X-rays. Sometimes the caregiver must wait until the swelling has gone down. TREATMENT Minor fractures that have caused no deformity often do not require treatment. More serious fractures where bones are displaced may require surgery. This will take place after the swelling is gone. Surgery will stabilize and align the fracture. HOME CARE INSTRUCTIONS   Put ice on the injured area.  Put ice in a plastic bag.  Place a towel between your skin and the bag.  Leave the ice on for 15-20 minutes, 03-04 times a day.  Take medications as directed by your caregiver.  Only take over-the-counter or prescription medicines for pain, discomfort, or fever as directed by your caregiver.  If your nose starts bleeding, squeeze the soft parts of the nose against the center wall while you are sitting in an upright position for 10 minutes.  Contact sports should be avoided for at least 3 to 4 weeks or as directed by your caregiver. SEEK MEDICAL CARE IF:  Your pain increases or becomes severe.  You continue to have nosebleeds.  The shape of your nose does not return to normal within 5 days.  You have pus draining from the nose. SEEK  IMMEDIATE MEDICAL CARE IF:   You have bleeding from your nose that does not stop after 20 minutes of pinching the nostrils closed and keeping ice on the nose.  You have clear fluid draining from your nose.  You notice a grape-like swelling on the dividing wall between the nostrils (septum). This is a collection of blood (hematoma) that must be drained to help prevent infection.  You have difficulty moving your eyes.  You have recurrent vomiting. Document Released: 12/16/1999 Document Revised: 03/12/2011 Document Reviewed: 04/03/2010 [facility] Patient Information 2015 [place], [place]. This information is not intended to replace advice given to you by your health care provider. Make sure you discuss any questions you have with your health care provider. Abrasion An abrasion is a cut or scrape of the skin. Abrasions do not extend through all layers of the skin and most heal within 10 days. It is important to care for your abrasion properly to prevent infection. CAUSES  Most abrasions are caused by falling on, or gliding across, the ground or other surface. When your skin rubs on  something, the outer and inner layer of skin rubs off, causing an abrasion. DIAGNOSIS  Your caregiver will be able to diagnose an abrasion during a physical exam.  TREATMENT  Your treatment depends on how large and deep the abrasion is. Generally, your abrasion will be cleaned with water and a mild soap to remove any dirt or debris. An antibiotic ointment may be put over the abrasion to prevent an infection. A bandage (dressing) may be wrapped around the abrasion to keep it from getting dirty.  You may need a tetanus shot if:  You cannot remember when you had your last tetanus shot.  You have never had a tetanus shot.  The injury broke your skin. If you get a tetanus shot, your arm may swell, get red, and feel warm to the touch. This is common and not a problem. If you need a tetanus shot and you choose not to have  one, there is a rare chance of getting tetanus. Sickness from tetanus can be serious.  HOME CARE INSTRUCTIONS   If a dressing was applied, change it at least once a day or as directed by your caregiver. If the bandage sticks, soak it off with warm water.   Wash the area with water and a mild soap to remove all the ointment 2 times a day. Rinse off the soap and pat the area dry with a clean towel.   Reapply any ointment as directed by your caregiver. This will help prevent infection and keep the bandage from sticking. Use gauze over the wound and under the dressing to help keep the bandage from sticking.   Change your dressing right away if it becomes wet or dirty.   Only take over-the-counter or prescription medicines for pain, discomfort, or fever as directed by your caregiver.   Follow up with your caregiver within 24-48 hours for a wound check, or as directed. If you were not given a wound-check appointment, look closely at your abrasion for redness, swelling, or pus. These are signs of infection. SEEK IMMEDIATE MEDICAL CARE IF:   You have increasing pain in the wound.   You have redness, swelling, or tenderness around the wound.   You have pus coming from the wound.   You have a fever or persistent symptoms for more than 2-3 days.  You have a fever and your symptoms suddenly get worse.  You have a bad smell coming from the wound or dressing.  MAKE SURE YOU:   Understand these instructions.  Will watch your condition.  Will get help right away if you are not doing well or get worse. Document Released: 09/27/2004 Document Revised: 12/05/2011 Document Reviewed: 11/21/2010 [facility] Patient Information 2015 [place], [place]. This information is not intended to replace advice given to you by your health care provider. Make sure you discuss any questions you have with your health care provider.

## 2013-11-27 NOTE — ED Notes (Signed)
Patient arrives via EMS with c/o fall. Witnesses state patient was walking, started staggering "like a drunk" and fell face first onto concrete. Swelling and multiple abrasions to nose and face. Arrives alert/oriented x 4. Pupils PERRL. Patient states he doesn't remember what made him fall. Denies dizziness, nausea, vomiting, chest pain, or shortness of breath.

## 2013-11-27 NOTE — ED Notes (Signed)
Pt alert & oriented x4, stable gait. Patient  given discharge instructions, paperwork & prescription(s). Patient verbalized understanding. Pt left department w/ no further questions. 

## 2013-11-29 LAB — URINE CULTURE
CULTURE: NO GROWTH
Colony Count: NO GROWTH

## 2013-12-17 ENCOUNTER — Encounter (HOSPITAL_COMMUNITY)
Admission: RE | Admit: 2013-12-17 | Discharge: 2013-12-17 | Disposition: A | Discharge status: Home / Self Care | Payer: Medicare Other | Source: Ambulatory Visit | Attending: General Surgery | Admitting: General Surgery

## 2013-12-17 ENCOUNTER — Encounter (HOSPITAL_COMMUNITY): Payer: Self-pay

## 2013-12-17 DIAGNOSIS — K648 Other hemorrhoids: Secondary | ICD-10-CM | POA: Diagnosis not present

## 2013-12-17 DIAGNOSIS — I10 Essential (primary) hypertension: Secondary | ICD-10-CM | POA: Diagnosis not present

## 2013-12-17 DIAGNOSIS — Z8546 Personal history of malignant neoplasm of prostate: Secondary | ICD-10-CM | POA: Diagnosis not present

## 2013-12-17 DIAGNOSIS — C7982 Secondary malignant neoplasm of genital organs: Secondary | ICD-10-CM | POA: Diagnosis present

## 2013-12-17 DIAGNOSIS — E876 Hypokalemia: Secondary | ICD-10-CM | POA: Diagnosis not present

## 2013-12-17 DIAGNOSIS — Z888 Allergy status to other drugs, medicaments and biological substances status: Secondary | ICD-10-CM | POA: Diagnosis not present

## 2013-12-17 DIAGNOSIS — H409 Unspecified glaucoma: Secondary | ICD-10-CM | POA: Diagnosis not present

## 2013-12-17 HISTORY — DX: Hypothyroidism, unspecified: E03.9

## 2013-12-17 NOTE — Consult Note (Signed)
NAME:  TYRI, ELMORE NO.:  [id number]  MEDICAL RECORD NO.:  [id number]  LOCATION:                                 FACILITY:  PHYSICIAN:  Felicie Morn, M.D. DATE OF BIRTH:  26-Jul-1934  DATE OF CONSULTATION:  12/16/2013 DATE OF DISCHARGE:                                CONSULTATION   NOTE:  Surgery was asked to see this 79 year old white male, known to me in the past.  He has been referred for the placement of a Port-A-Cath for treatment of metastatic prostate cancer.  He has been seen and followed by Dr. Tressie Stalker in the past.  His prostate cancer is advanced to include metastasis to liver and bone.  We will plan for placement of Port-A-Cath via the outpatient department.  I discussed complications not limited to, but including bleeding, infection, pneumothorax, blood clots and catheter embolization.  Informed consent was obtained.  PAST MEDICAL HISTORY:  Positive for hypertension, carcinoma of the prostate and history of brain tumor.  PAST SURGICAL HISTORY:  Has included brain surgery in 2006, cataract surgery on his left eye in 2013 and he had a TNA during childhood.  ALLERGIES:  He had postoperatively from his brain surgery when he was placed on Dilantin, he developed Stevens-Johnson syndrome.  SOCIAL HISTORY:  He is a social drinker.  No history of alcohol abuse. He is a nonsmoker.  MEDICATION:  See medication list.  PHYSICAL EXAMINATION:  GENERAL:  He is 5 feet 10 inches, weighs 196 pounds. VITAL SIGNS:  Temperature is 98.6, pulse is 68, respirations 12, blood pressure 150/86. HEENT:  Head is normocephalic.  Eyes, extraocular movements are intact. Pupils are round and reactive to light and accommodation.  There is no conjunctival pallor or scleral injection.  The patient has had a previous left cataract surgery.  No bruits are appreciated.  No cervical adenopathy appreciated. CHEST:  Fairly clear both to anterior and posterior  auscultation. HEART:  Regular rhythm. ABDOMEN:  Soft, somewhat globus.  Bowel sounds are normoactive.  No obvious hernias are appreciated.  On his right chest in the scapula area, he has rather large lipoma.  This is at least 10 cm in diameter. RECTAL:  Deferred.  Genitalia with testicular atrophy, is a sequelae from his treatment for prostate cancer. EXTREMITIES:  No obvious edema, varicosities, or cyanosis.  REVIEW OF SYSTEMS:  NEURO:  Past history of brain tumor treated with a craniotomy in 2006.  No history of migraines or seizures except the seizures are leading to the diagnosis of his brain tumor in 2006. ENDOCRINE:  No history of diabetes.  He does take some thyroid supplements for hypothyroidism.  CARDIOPULMONARY:  He has a history of hypertension and I believe also hypercholesterolemia as well. MUSCULOSKELETAL:  He has some scaly skin dermatitis.  GI:  No history of hepatitis.  He has a history of constipation occasionally, no diarrhea, bright red rectal bleeding, black tarry stools, or melena.  No unexplained weight loss.  He had a colonoscopy in 2005.  GU:  No history of frequency, dysuria, or kidney stones.  Obviously, he has a history of cancer of the prostate.  REVIEW OF HISTORY AND PHYSICAL:  Therefore, Mr. Ahart is a 79 year old white male who has been referred for the placement of a Port-A-Cath. I will probably place this on the left side as he has had a previous PICC line placed on the right side in the past.  I will place this on the left side.     Felicie Morn, M.D.     WB/MEDQ  D:  12/16/2013  T:  12/17/2013  Job:  [id number]  cc:   Gaston Islam. Tressie Stalker, MD Fax: [phone]

## 2013-12-17 NOTE — Pre-Procedure Instructions (Signed)
Patient given information to sign up for my chart at home. 

## 2013-12-17 NOTE — Patient Instructions (Addendum)
Shane Hall  12/17/2013  Your procedure is scheduled on:  12/18/2013  Report to [facility] at 20  AM.  Call this number if you have problems the morning of surgery: [phone]   Remember:   Do not eat food or drink liquids after midnight.   Take these medicines the morning of surgery with A SIP OF WATER:  Amlodipine, benicar, gabapentin, levothyroxine,metoprolol, prednisone, zytiga.   Do not wear jewelry, make-up or nail polish.  Do not wear lotions, powders, or perfumes.   Do not shave 48 hours prior to surgery. Men may shave face and neck.  Do not bring valuables to the hospital.  [facility] is not responsible for any belongings or valuables.               Contacts, dentures or bridgework may not be worn into surgery.  Leave suitcase in the car. After surgery it may be brought to your room.  For patients admitted to the hospital, discharge time is determined by your treatment team.               Patients discharged the day of surgery will not be allowed to drive home.  Name and phone number of your driver: family  Special Instructions: Shower using CHG 2 nights before surgery and the night before surgery.  If you shower the day of surgery use CHG.  Use special wash - you have one bottle of CHG for all showers.  You should use approximately 1/3 of the bottle for each shower.   Please read over the following fact sheets that you were given: Pain Booklet, Coughing and Deep Breathing, Surgical Site Infection Prevention, Anesthesia Post-op Instructions and Care and Recovery After Surgery Implanted Port Insertion An implanted port is a central line that has a round shape and is placed under the skin. It is used as a long-term IV access for:   Medicines, such as chemotherapy.   Fluids.   Liquid nutrition, such as total parenteral nutrition (TPN).   Blood samples.  LET [facility] CARE PROVIDER KNOW ABOUT:  Allergies to food or medicine.   Medicines taken,  including vitamins, herbs, eye drops, creams, and over-the-counter medicines.   Any allergies to heparin.  Use of steroids (by mouth or creams).   Previous problems with anesthetics or numbing medicines.   History of bleeding problems or blood clots.   Previous surgery.   Other health problems, including diabetes and kidney problems.   Possibility of pregnancy, if this applies. RISKS AND COMPLICATIONS Generally, this is a safe procedure. However, as with any procedure, problems can occur. Possible problems include:  Damage to the blood vessel, bruising, or bleeding at the puncture site.   Infection.  Blood clot in the vessel that the port is in.  Breakdown of the skin over your port.  Very rarely a person may develop a condition called a pneumothorax, a collection of air in the chest that may cause one of the lungs to collapse. The placement of these catheters with the appropriate imaging guidance significantly decreases the risk of a pneumothorax.  BEFORE THE PROCEDURE   Your health care provider may want you to have blood tests. These tests can help tell how well your kidneys and liver are working. They can also show how well your blood clots.   If you take blood thinners (anticoagulant medicines), ask your health care provider when you should stop taking them.   Make arrangements for someone  to drive you home. This is necessary if you have been sedated for your procedure.  PROCEDURE  Port insertion usually takes about 30-45 minutes.   An IV needle will be inserted in your arm. Medicine for pain and medicine to help relax you (sedative) will flow directly into your body through this needle.   You will lie on an exam table, and you will be connected to monitors to keep track of your heart rate, blood pressure, and breathing throughout the procedure.  An oxygen monitoring device may be attached to your finger. Oxygen will be given.   Everything will be kept as  germ free (sterile) as possible during the procedure. The skin near the point of the incision will be cleansed with antiseptic, and the area will be draped with sterile towels. The skin and deeper tissues over the port area will be made numb with a local anesthetic.  Two small cuts (incisions) will be made in the skin to insert the port. One will be made in the neck to obtain access to the vein where the catheter will lie.   Because the port reservoir will be placed under the skin, a small skin incision will be made in the upper chest, and a small pocket for the port will be made under the skin. The catheter that will be connected to the port tunnels to a large central vein in the chest. A small, raised area will remain on your body at the site of the reservoir when the procedure is complete.  The port placement will be done under imaging guidance to ensure the proper placement.  The reservoir has a silicone covering that can be punctured with a special needle.   The port will be flushed with normal saline, and blood will be drawn to make sure it is working properly.  There will be nothing remaining outside the skin when the procedure is finished.   Incisions will be held together by stitches, surgical glue, or a special tape. AFTER THE PROCEDURE  You will stay in a recovery area until the anesthesia has worn off. Your blood pressure and pulse will be checked.  A final chest X-ray will be taken to check the placement of the port and to ensure that there is no injury to your lung. Document Released: 10/08/2012 Document Revised: 05/04/2013 Document Reviewed: 10/08/2012 [facility] Patient Information 2015 [place], [place]. This information is not intended to replace advice given to you by your health care provider. Make sure you discuss any questions you have with your health care provider. PATIENT INSTRUCTIONS POST-ANESTHESIA  IMMEDIATELY FOLLOWING SURGERY:  Do not drive or operate machinery  for the first twenty four hours after surgery.  Do not make any important decisions for twenty four hours after surgery or while taking narcotic pain medications or sedatives.  If you develop intractable nausea and vomiting or a severe headache please notify your doctor immediately.  FOLLOW-UP:  Please make an appointment with your surgeon as instructed. You do not need to follow up with anesthesia unless specifically instructed to do so.  WOUND CARE INSTRUCTIONS (if applicable):  Keep a dry clean dressing on the anesthesia/puncture wound site if there is drainage.  Once the wound has quit draining you may leave it open to air.  Generally you should leave the bandage intact for twenty four hours unless there is drainage.  If the epidural site drains for more than 36-48 hours please call the anesthesia department.  QUESTIONS?:  Please feel free to  call your physician or the hospital operator if you have any questions, and they will be happy to assist you.

## 2013-12-18 ENCOUNTER — Ambulatory Visit (HOSPITAL_COMMUNITY)
Admission: RE | Admit: 2013-12-18 | Discharge: 2013-12-18 | Disposition: A | Discharge status: Home / Self Care | Payer: Medicare Other | Source: Ambulatory Visit | Attending: General Surgery | Admitting: General Surgery

## 2013-12-18 ENCOUNTER — Ambulatory Visit (HOSPITAL_COMMUNITY): Payer: Medicare Other

## 2013-12-18 ENCOUNTER — Ambulatory Visit (HOSPITAL_COMMUNITY): Payer: Medicare Other | Admitting: Anesthesiology

## 2013-12-18 ENCOUNTER — Encounter (HOSPITAL_COMMUNITY)
Admission: RE | Disposition: A | Discharge status: Home / Self Care | Payer: Self-pay | Source: Ambulatory Visit | Attending: General Surgery

## 2013-12-18 ENCOUNTER — Encounter (HOSPITAL_COMMUNITY): Payer: Self-pay | Admitting: *Deleted

## 2013-12-18 DIAGNOSIS — C7982 Secondary malignant neoplasm of genital organs: Secondary | ICD-10-CM | POA: Diagnosis not present

## 2013-12-18 DIAGNOSIS — Z8546 Personal history of malignant neoplasm of prostate: Secondary | ICD-10-CM | POA: Insufficient documentation

## 2013-12-18 DIAGNOSIS — Z888 Allergy status to other drugs, medicaments and biological substances status: Secondary | ICD-10-CM | POA: Insufficient documentation

## 2013-12-18 DIAGNOSIS — I10 Essential (primary) hypertension: Secondary | ICD-10-CM | POA: Diagnosis not present

## 2013-12-18 DIAGNOSIS — E876 Hypokalemia: Secondary | ICD-10-CM | POA: Insufficient documentation

## 2013-12-18 DIAGNOSIS — K648 Other hemorrhoids: Secondary | ICD-10-CM | POA: Insufficient documentation

## 2013-12-18 DIAGNOSIS — H409 Unspecified glaucoma: Secondary | ICD-10-CM | POA: Insufficient documentation

## 2013-12-18 DIAGNOSIS — Z95828 Presence of other vascular implants and grafts: Secondary | ICD-10-CM

## 2013-12-18 HISTORY — PX: PORTACATH PLACEMENT: SHX2246

## 2013-12-18 SURGERY — INSERTION, TUNNELED CENTRAL VENOUS DEVICE, WITH PORT
Anesthesia: Monitor Anesthesia Care | Site: Chest | Laterality: Left

## 2013-12-18 MED ORDER — MIDAZOLAM HCL 2 MG/2ML IJ SOLN
1.0000 mg | INTRAMUSCULAR | Status: DC | PRN
  Administered 2013-12-18 (×2): 2 mg via INTRAVENOUS
  Filled 2013-12-18: qty 2

## 2013-12-18 MED ORDER — FENTANYL CITRATE 0.05 MG/ML IJ SOLN: 25.0000 ug | INTRAMUSCULAR | Status: DC | PRN

## 2013-12-18 MED ORDER — LIDOCAINE HCL (PF) 1 % IJ SOLN
INTRAMUSCULAR | Status: AC
  Filled 2013-12-18: qty 30

## 2013-12-18 MED ORDER — LACTATED RINGERS IV SOLN
INTRAVENOUS | Status: DC
  Administered 2013-12-18: 12:00:00 via INTRAVENOUS

## 2013-12-18 MED ORDER — FENTANYL CITRATE 0.05 MG/ML IJ SOLN
INTRAMUSCULAR | Status: DC | PRN
  Administered 2013-12-18 (×4): 25 ug via INTRAVENOUS

## 2013-12-18 MED ORDER — FENTANYL CITRATE 0.05 MG/ML IJ SOLN
INTRAMUSCULAR | Status: AC
  Filled 2013-12-18: qty 2

## 2013-12-18 MED ORDER — PROPOFOL 10 MG/ML IV BOLUS
INTRAVENOUS | Status: AC
  Filled 2013-12-18: qty 20

## 2013-12-18 MED ORDER — FENTANYL CITRATE 0.05 MG/ML IJ SOLN
25.0000 ug | INTRAMUSCULAR | Status: AC
  Administered 2013-12-18 (×2): 25 ug via INTRAVENOUS
  Filled 2013-12-18: qty 2

## 2013-12-18 MED ORDER — ONDANSETRON HCL 4 MG/2ML IJ SOLN
INTRAMUSCULAR | Status: AC
  Filled 2013-12-18: qty 2

## 2013-12-18 MED ORDER — CEFAZOLIN SODIUM 1-5 GM-% IV SOLN
1.0000 g | Freq: Once | INTRAVENOUS | Status: AC
  Administered 2013-12-18: 1 g via INTRAVENOUS

## 2013-12-18 MED ORDER — MIDAZOLAM HCL 2 MG/2ML IJ SOLN
INTRAMUSCULAR | Status: AC
  Filled 2013-12-18: qty 2

## 2013-12-18 MED ORDER — SODIUM CHLORIDE 0.9 % IR SOLN
Status: DC | PRN
  Administered 2013-12-18: 10 mL

## 2013-12-18 MED ORDER — LIDOCAINE HCL (PF) 1 % IJ SOLN
INTRAMUSCULAR | Status: DC | PRN
  Administered 2013-12-18: 6 mL

## 2013-12-18 MED ORDER — PROPOFOL 10 MG/ML IV BOLUS
INTRAVENOUS | Status: DC | PRN
  Administered 2013-12-18 (×4): 10 mg via INTRAVENOUS

## 2013-12-18 MED ORDER — ARTIFICIAL TEARS OP OINT
TOPICAL_OINTMENT | OPHTHALMIC | Status: AC
  Filled 2013-12-18: qty 7

## 2013-12-18 MED ORDER — HEPARIN SOD (PORK) LOCK FLUSH 100 UNIT/ML IV SOLN
INTRAVENOUS | Status: DC | PRN
  Administered 2013-12-18: 500 [IU] via INTRAVENOUS

## 2013-12-18 MED ORDER — HEPARIN SOD (PORK) LOCK FLUSH 100 UNIT/ML IV SOLN
INTRAVENOUS | Status: AC
  Filled 2013-12-18: qty 5

## 2013-12-18 MED ORDER — ONDANSETRON HCL 4 MG/2ML IJ SOLN: 4.0000 mg | Freq: Once | INTRAMUSCULAR | Status: DC | PRN

## 2013-12-18 MED ORDER — BACITRACIN-NEOMYCIN-POLYMYXIN 400-5-5000 EX OINT
TOPICAL_OINTMENT | CUTANEOUS | Status: AC
  Filled 2013-12-18: qty 1

## 2013-12-18 MED ORDER — CEFAZOLIN SODIUM 1-5 GM-% IV SOLN
INTRAVENOUS | Status: AC
  Filled 2013-12-18: qty 50

## 2013-12-18 MED ORDER — ONDANSETRON HCL 4 MG/2ML IJ SOLN
4.0000 mg | Freq: Once | INTRAMUSCULAR | Status: AC
  Administered 2013-12-18: 4 mg via INTRAVENOUS

## 2013-12-18 MED ORDER — LIDOCAINE HCL (PF) 1 % IJ SOLN
INTRAMUSCULAR | Status: AC
  Filled 2013-12-18: qty 5

## 2013-12-18 MED ORDER — PROPOFOL INFUSION 10 MG/ML OPTIME
INTRAVENOUS | Status: DC | PRN
  Administered 2013-12-18: 100 ug/kg/min via INTRAVENOUS
  Administered 2013-12-18: 30 ug/kg/min via INTRAVENOUS

## 2013-12-18 SURGICAL SUPPLY — 43 items
APPLIER CLIP 9.375 SM OPEN (CLIP)
APR CLP SM 9.3 20 MLT OPN (CLIP)
BAG DECANTER FOR FLEXI CONT (MISCELLANEOUS) ×3 IMPLANT
BAG HAMPER (MISCELLANEOUS) ×3 IMPLANT
CLIP APPLIE 9.375 SM OPEN (CLIP) IMPLANT
CLOSURE WOUND 1/4 X3 (GAUZE/BANDAGES/DRESSINGS) ×1
CLOTH BEACON ORANGE TIMEOUT ST (SAFETY) ×3 IMPLANT
COVER LIGHT HANDLE STERIS (MISCELLANEOUS) ×6 IMPLANT
DECANTER SPIKE VIAL GLASS SM (MISCELLANEOUS) ×3 IMPLANT
DRAPE C-ARM FOLDED MOBILE STRL (DRAPES) ×3 IMPLANT
DRSG TEGADERM 2-3/8X2-3/4 SM (GAUZE/BANDAGES/DRESSINGS) ×3 IMPLANT
DURAPREP 26ML APPLICATOR (WOUND CARE) ×3 IMPLANT
ELECT REM PT RETURN 9FT ADLT (ELECTROSURGICAL) ×3
ELECTRODE REM PT RTRN 9FT ADLT (ELECTROSURGICAL) ×1 IMPLANT
GLOVE SKINSENSE NS SZ7.0 (GLOVE) ×2
GLOVE SKINSENSE STRL SZ7.0 (GLOVE) ×1 IMPLANT
GOWN STRL REUS W/TWL LRG LVL3 (GOWN DISPOSABLE) ×6 IMPLANT
IV NS 500ML (IV SOLUTION) ×3
IV NS 500ML BAXH (IV SOLUTION) ×1 IMPLANT
KIT PORT POWER 8FR ISP MRI (CATHETERS) ×3 IMPLANT
KIT ROOM TURNOVER APOR (KITS) ×3 IMPLANT
MANIFOLD NEPTUNE II (INSTRUMENTS) ×3 IMPLANT
NDL HYPO 18GX1.5 BLUNT FILL (NEEDLE) ×1 IMPLANT
NDL HYPO 25X1 1.5 SAFETY (NEEDLE) ×1 IMPLANT
NEEDLE HYPO 18GX1.5 BLUNT FILL (NEEDLE) ×3 IMPLANT
NEEDLE HYPO 25X1 1.5 SAFETY (NEEDLE) ×3 IMPLANT
PACK MINOR (CUSTOM PROCEDURE TRAY) ×3 IMPLANT
PAD ARMBOARD 7.5X6 YLW CONV (MISCELLANEOUS) ×3 IMPLANT
SET BASIN LINEN APH (SET/KITS/TRAYS/PACK) ×3 IMPLANT
SHEATH COOK PEEL AWAY SET 8F (SHEATH) IMPLANT
SPONGE GAUZE 2X2 8PLY STER LF (GAUZE/BANDAGES/DRESSINGS) ×1
SPONGE GAUZE 2X2 8PLY STRL LF (GAUZE/BANDAGES/DRESSINGS) ×2 IMPLANT
STRIP CLOSURE SKIN 1/4X3 (GAUZE/BANDAGES/DRESSINGS) ×2 IMPLANT
SUT VIC AB 4-0 SH 27 (SUTURE) ×3
SUT VIC AB 4-0 SH 27XBRD (SUTURE) ×1 IMPLANT
SUT VIC AB 5-0 P-3 18X BRD (SUTURE) ×1 IMPLANT
SUT VIC AB 5-0 P3 18 (SUTURE) ×3
SYR 20CC LL (SYRINGE) ×3 IMPLANT
SYR 5ML LL (SYRINGE) ×6 IMPLANT
SYR BULB IRRIGATION 50ML (SYRINGE) ×3 IMPLANT
SYR CONTROL 10ML LL (SYRINGE) ×3 IMPLANT
TOWEL OR 17X26 4PK STRL BLUE (TOWEL DISPOSABLE) ×3 IMPLANT
WATER STERILE IRR 1000ML POUR (IV SOLUTION) ×6 IMPLANT

## 2013-12-18 NOTE — Anesthesia Procedure Notes (Signed)
Procedure Name: MAC Date/Time: 12/18/2013 1:28 PM Performed by: Vista Deck Pre-anesthesia Checklist: Patient identified, Emergency Drugs available, Suction available, Timeout performed and Patient being monitored Patient Re-evaluated:Patient Re-evaluated prior to inductionOxygen Delivery Method: Nasal Cannula    Procedure Name: MAC Date/Time: 12/18/2013 1:42 PM Performed by: Vista Deck Pre-anesthesia Checklist: Patient identified, Emergency Drugs available, Suction available, Timeout performed and Patient being monitored Patient Re-evaluated:Patient Re-evaluated prior to inductionOxygen Delivery Method: Non-rebreather mask

## 2013-12-18 NOTE — Discharge Instructions (Signed)
Implanted Port Insertion, Care After Refer to this sheet in the next few weeks. These instructions provide you with information on caring for yourself after your procedure. Your health care provider may also give you more specific instructions. Your treatment has been planned according to current medical practices, but problems sometimes occur. Call your health care provider if you have any problems or questions after your procedure. WHAT TO EXPECT AFTER THE PROCEDURE After your procedure, it is typical to have the following:   Discomfort at the port insertion site. Ice packs to the area will help.  Bruising on the skin over the port. This will subside in 3-4 days. HOME CARE INSTRUCTIONS  After your port is placed, you will get a manufacturer's information card. The card has information about your port. Keep this card with you at all times.   Know what kind of port you have. There are many types of ports available.   Wear a medical alert bracelet in case of an emergency. This can help alert health care workers that you have a port.   The port can stay in for as long as your health care provider believes it is necessary.   A home health care nurse may give medicines and take care of the port.   You or a family member can get special training and directions for giving medicine and taking care of the port at home.  SEEK MEDICAL CARE IF:   Your port does not flush or you are unable to get a blood return.   You have a fever or chills. SEEK IMMEDIATE MEDICAL CARE IF:  You have new fluid or pus coming from your incision.   You notice a bad smell coming from your incision site.   You have swelling, pain, or more redness at the incision or port site.   You have chest pain or shortness of breath. Document Released: 10/08/2012 Document Revised: 12/23/2012 Document Reviewed: 10/08/2012 [employer] Patient Information 2015 [employer]. This information is not intended to replace  advice given to you by your health care provider. Make sure you discuss any questions you have with your health care provider.  

## 2013-12-18 NOTE — Anesthesia Preprocedure Evaluation (Addendum)
Anesthesia Evaluation  Patient identified by MRN, date of birth, ID band Patient awake    Reviewed: Allergy & Precautions, H&P , NPO status , Patient's Chart, lab work & pertinent test results, reviewed documented beta blocker date and time   Airway Mallampati: II  TM Distance: >3 FB     Dental  (+) Teeth Intact, Implants   Pulmonary  breath sounds clear to auscultation        Cardiovascular hypertension, Pt. on medications and Pt. on home beta blockers Rhythm:Regular Rate:Normal     Neuro/Psych    GI/Hepatic   Endo/Other  Hypothyroidism   Renal/GU      Musculoskeletal   Abdominal   Peds  Hematology   Anesthesia Other Findings   Reproductive/Obstetrics                            Anesthesia Physical Anesthesia Plan  ASA: III  Anesthesia Plan: MAC   Post-op Pain Management:    Induction: Intravenous  Airway Management Planned: Simple Face Mask  Additional Equipment:   Intra-op Plan:   Post-operative Plan:   Informed Consent: I have reviewed the patients History and Physical, chart, labs and discussed the procedure including the risks, benefits and alternatives for the proposed anesthesia with the patient or authorized representative who has indicated his/her understanding and acceptance.     Plan Discussed with:   Anesthesia Plan Comments:         Anesthesia Quick Evaluation

## 2013-12-18 NOTE — Progress Notes (Signed)
52  [place] Male for placement of porta cath for treatment of metastatic prostate CA.  Procedure and risks addressed and informed consent obtained.  Labs reviewed, not repeated since oncology as per anesthesia,.  No clinical change since H&P, dict # [id number].  Filed Vitals:   12/18/13 1320  BP: 153/64  Pulse:   Temp:   Resp: 15  pulse 60/min, temp 98.2

## 2013-12-18 NOTE — Brief Op Note (Signed)
12/18/2013  2:57 PM  PATIENT:  Shane Hall  79 y.o. male  PRE-OPERATIVE DIAGNOSIS:  metastatic prostate cancer  POST-OPERATIVE DIAGNOSIS:  metastatic prostate cancer  PROCEDURE:  Procedure(s): INSERTION PORT-A-CATH-Left subclavian (Left)  SURGEON:  Surgeon(s) and Role:    * Scherry Ran, MD - Primary  PHYSICIAN ASSISTANT:   ASSISTANTS: none   ANESTHESIA:   IV sedation  EBL:  Total I/O In: 500 [I.V.:500] Out: 20 [Blood:20]  BLOOD ADMINISTERED:none  DRAINS: none   LOCAL MEDICATIONS USED:  XYLOCAINE   SPECIMEN:  No Specimen  DISPOSITION OF SPECIMEN:  N/A  COUNTS:  YES  TOURNIQUET:  * No tourniquets in log *  DICTATION: .Other Dictation: Dictation Number OR dict # [id number].  PLAN OF CARE: Discharge to home after PACU  PATIENT DISPOSITION:  PACU - hemodynamically stable.   Delay start of Pharmacological VTE agent (>24hrs) due to surgical blood loss or risk of bleeding: not applicable

## 2013-12-18 NOTE — Progress Notes (Signed)
Post OP check  Filed Vitals:   12/18/13 1445  BP: 172/79  Pulse: 67  Temp:   Resp: 16  temp 98.1  Awake and alert.  Wound clean and dry Post OP CXR shows no pneumo and tip of cath in SVC.  Discharge and follow up explained to pt and daughter.

## 2013-12-18 NOTE — Anesthesia Postprocedure Evaluation (Signed)
  Anesthesia Post-op Note  Patient: Shane Hall  Procedure(s) Performed: Procedure(s): INSERTION PORT-A-CATH-Left subclavian (Left)  Patient Location: PACU  Anesthesia Type:MAC  Level of Consciousness: awake, alert  and patient cooperative  Airway and Oxygen Therapy: Patient Spontanous Breathing and Patient connected to nasal cannula oxygen  Post-op Pain: none  Post-op Assessment: Post-op Vital signs reviewed, Patient's Cardiovascular Status Stable and Respiratory Function Stable  Post-op Vital Signs: Reviewed and stable   Complications: No apparent anesthesia complications. Chest xray pending in PACU.

## 2013-12-18 NOTE — Brief Op Note (Signed)
12/18/2013  2:35 PM  PATIENT:  Bertram Gala  79 y.o. male  PRE-OPERATIVE DIAGNOSIS:  metastatic prostate cancer  POST-OPERATIVE DIAGNOSIS:  metastatic prostate cancer  PROCEDURE:  Procedure(s): INSERTION PORT-A-CATH-Left subclavian (Left)  SURGEON:  Surgeon(s) and Role:    * Scherry Ran, MD - Primary  PHYSICIAN ASSISTANT:   ASSISTANTS: none   ANESTHESIA:   IV sedation  EBL:  Total I/O In: 500 [I.V.:500] Out: 20 [Blood:20]  BLOOD ADMINISTERED:none  DRAINS: none   LOCAL MEDICATIONS USED:  XYLOCAINE   SPECIMEN:  No Specimen  DISPOSITION OF SPECIMEN:  N/A  COUNTS:  YES  TOURNIQUET:  * No tourniquets in log *  DICTATION: .Other Dictation: Dictation Number OR dict# [id number].  PLAN OF CARE: Discharge to home after PACU  PATIENT DISPOSITION:  PACU - hemodynamically stable.   Delay start of Pharmacological VTE agent (>24hrs) due to surgical blood loss or risk of bleeding: not applicable

## 2013-12-18 NOTE — Transfer of Care (Signed)
Immediate Anesthesia Transfer of Care Note  Patient: Shane Hall  Procedure(s) Performed: Procedure(s) (LRB): INSERTION PORT-A-CATH-Left subclavian (Left)  Patient Location: PACU  Anesthesia Type: MAC  Level of Consciousness: awake  Airway & Oxygen Therapy: Patient Spontanous Breathing. Nasal cannula  Post-op Assessment: Report given to PACU RN, Post -op Vital signs reviewed and stable and Patient moving all extremities  Post vital signs: Reviewed and stable  Complications: No apparent anesthesia complications

## 2013-12-19 NOTE — Op Note (Signed)
NAME:  Shane Hall, EGGER NO.:  [id number]  MEDICAL RECORD NO.:  [id number]  LOCATION:  [facility]                          FACILITY:  [facility]  PHYSICIAN:  Felicie Morn, M.D. DATE OF BIRTH:  Dec 16, 1934  DATE OF PROCEDURE:  12/18/2013 DATE OF DISCHARGE:  12/18/2013                              OPERATIVE REPORT   DIAGNOSIS:  Cancer of the prostate, metastatic.  PROCEDURE:  Placement of Port-A-Cath through left subclavian vein.  WOUND CLASSIFICATION:  Clean.  NOTE:  This is a 79 year old white male who was diagnosed with metastatic carcinoma of the prostate, he was referred for placement of Port-A-Cath.  I discussed the complications not limited to but including bleeding, infection, thrombosis, pneumothorax, and catheter embolization. Informed consent was obtained.  GROSS OPERATIVE FINDINGS:  The Port-A-Cath was placed under fluoroscopy, without any problems, the tip of it was left in the superior vena cava. Clinically, there was no drop in O2 sat.  Postoperative chest x-ray is pending.  TECHNIQUE:  The patient was placed in the supine position.  After the adequate administration of IV sedation, he was prepped with DuraPrep and then placed in the Trendelenburg position.  I used an 18-gauge needle and cannulated the left subclavian vein.  The guidewire was passed through this and later venous dilator and the Silastic catheter was advanced under direct fluoroscopy.  After checking for its position, we then flushed the infusion device, connected the infusion device, and then aspirated and then, flushed the Port-A-Cath with heparinized solution.  We placed the infusion device in a subcutaneous pocket.  We then irrigated the pocket, closed the skin with 3-0 Polysorb and the skin subcuticularly with 5-0 subcuticular Vicryl suture.  Quarter-inch Steri-Strips, Neosporin, 2x2, and Tegaderm dressing was applied.  Prior to closure, all sponge, needle, and instrument counts  were found to be correct. Estimated blood loss was minimal.  The patient received approximately 500 mL of crystalloids intraoperatively.  No drains were placed.  There were no complications.     Felicie Morn, M.D.     WB/MEDQ  D:  12/18/2013  T:  12/19/2013  Job:  [id number]  cc:   Dr. Caswell Corwin S. Tressie Stalker, MD Fax: [phone]

## 2013-12-21 ENCOUNTER — Encounter (HOSPITAL_COMMUNITY): Payer: Self-pay | Admitting: General Surgery

## 2013-12-21 MED ORDER — BACITRACIN-NEOMYCIN-POLYMYXIN 400-5-5000 EX OINT
TOPICAL_OINTMENT | CUTANEOUS | Status: DC | PRN
  Administered 2013-12-18: 1 via TOPICAL

## 2013-12-21 NOTE — Progress Notes (Signed)
Wasted 150 mcg  gave 50 mcg witnessed by [employer] rn

## 2014-03-19 ENCOUNTER — Ambulatory Visit: Payer: Medicare Other | Admitting: Urology

## 2014-04-02 DEATH — deceased
